# Patient Record
Sex: Female | Born: 1970 | Race: White | Hispanic: No | Marital: Single | Smoking: Current some day smoker
Health system: Southern US, Community
[De-identification: ages and names within clinical notes are randomized; demographics above are authoritative.]

## PROBLEM LIST (undated history)

## (undated) DIAGNOSIS — F419 Anxiety disorder, unspecified: Secondary | ICD-10-CM

## (undated) DIAGNOSIS — T7840XA Allergy, unspecified, initial encounter: Secondary | ICD-10-CM

## (undated) DIAGNOSIS — G43909 Migraine, unspecified, not intractable, without status migrainosus: Secondary | ICD-10-CM

## (undated) HISTORY — DX: Anxiety disorder, unspecified: F41.9

## (undated) HISTORY — DX: Migraine, unspecified, not intractable, without status migrainosus: G43.909

## (undated) HISTORY — DX: Allergy, unspecified, initial encounter: T78.40XA

## (undated) HISTORY — PX: BUNIONECTOMY: SHX129

## (undated) HISTORY — PX: HX BREAST IMPLANT: 2100001350

## (undated) HISTORY — PX: MOUTH SURGERY: SHX715

---

## 1998-05-20 HISTORY — PX: HX BUNIONECTOMY: SHX129

## 2003-05-21 HISTORY — PX: PLACEMENT OF BREAST IMPLANTS: SHX6334

## 2010-05-20 HISTORY — PX: TOOTH EXTRACTION: SUR596

## 2016-08-20 ENCOUNTER — Other Ambulatory Visit (HOSPITAL_COMMUNITY): Payer: Self-pay | Admitting: Family Medicine

## 2016-08-20 ENCOUNTER — Other Ambulatory Visit: Attending: Family Medicine

## 2016-08-20 ENCOUNTER — Other Ambulatory Visit (HOSPITAL_BASED_OUTPATIENT_CLINIC_OR_DEPARTMENT_OTHER): Payer: Self-pay | Admitting: Family Medicine

## 2016-08-20 DIAGNOSIS — Z1239 Encounter for other screening for malignant neoplasm of breast: Secondary | ICD-10-CM

## 2016-08-20 DIAGNOSIS — Z01419 Encounter for gynecological examination (general) (routine) without abnormal findings: Secondary | ICD-10-CM | POA: Insufficient documentation

## 2016-08-23 LAB — HISTORICAL CYTOPATHOLOGY-GYN (PAP AND HPV TESTS)

## 2016-10-07 ENCOUNTER — Ambulatory Visit (HOSPITAL_BASED_OUTPATIENT_CLINIC_OR_DEPARTMENT_OTHER)

## 2017-01-23 ENCOUNTER — Ambulatory Visit (HOSPITAL_BASED_OUTPATIENT_CLINIC_OR_DEPARTMENT_OTHER): Payer: Self-pay | Admitting: FAMILY MEDICINE

## 2017-02-05 ENCOUNTER — Ambulatory Visit (INDEPENDENT_AMBULATORY_CARE_PROVIDER_SITE_OTHER): Admitting: FAMILY MEDICINE

## 2017-02-05 ENCOUNTER — Encounter (HOSPITAL_BASED_OUTPATIENT_CLINIC_OR_DEPARTMENT_OTHER): Payer: Self-pay | Admitting: FAMILY MEDICINE

## 2017-02-05 ENCOUNTER — Ambulatory Visit: Attending: FAMILY MEDICINE

## 2017-02-05 VITALS — BP 102/44 | HR 66 | Ht 64.0 in | Wt 140.4 lb

## 2017-02-05 DIAGNOSIS — Z6824 Body mass index (BMI) 24.0-24.9, adult: Secondary | ICD-10-CM

## 2017-02-05 DIAGNOSIS — R5382 Chronic fatigue, unspecified: Principal | ICD-10-CM | POA: Insufficient documentation

## 2017-02-05 DIAGNOSIS — E28319 Asymptomatic premature menopause: Secondary | ICD-10-CM

## 2017-02-05 DIAGNOSIS — M419 Scoliosis, unspecified: Secondary | ICD-10-CM

## 2017-02-05 DIAGNOSIS — Z720 Tobacco use: Secondary | ICD-10-CM

## 2017-02-05 HISTORY — DX: Scoliosis, unspecified: M41.9

## 2017-02-05 HISTORY — DX: Tobacco use: Z72.0

## 2017-02-05 LAB — FSH: FSH: 4.67 m[IU]/mL

## 2017-02-05 LAB — THYROID STIMULATING HORMONE (SENSITIVE TSH): TSH: 1.11 u[IU]/mL (ref 0.465–4.680)

## 2017-02-05 LAB — THYROXINE, FREE (FREE T4): THYROXINE (T4), FREE: 1.22 ng/dL (ref 0.78–2.19)

## 2017-02-05 MED ORDER — TEMAZEPAM 30 MG CAPSULE
30.00 mg | ORAL_CAPSULE | Freq: Every evening | ORAL | 2 refills | Status: DC | PRN
Start: 2017-02-05 — End: 2017-04-18

## 2017-02-05 NOTE — Progress Notes (Signed)
[facility] Medicine  [facility], [facility]  [address]  [place] [address]      Name: Rhonda Griffin  MRN: [id number]    Patient ID: Rhonda Griffin is an 46 y.o. female.  Chief Complaint:     Chief Complaint   Patient presents with   . Establish Care   . Hormone Problem     HPI/Subjective:  HPI  Pt is a 46 yo female that presents today to establish care and inquire about hormone testing. Pt stated she was referred by a prior pt due to their findings of hormonal imbalance and life changing experience with the proper treatment. The pt reported that over the last few years she has started to feel different and isnt finding joy in her everyday life anymore due to being constantly tired during the day and wide awake at night. She reports that her menstrual cycle is still occurring regularly with is having no problems but she is starting to develop hot flashes. The pt asked questions about pre menopausal sxs and it was explained to her that some of the symptoms she is experiencing could be due to that or it could be due to other hormonal imbalances. 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 She stated that the work doesn't give her anxiety or stress but more so her everyday life. She stated that she has lost all energy to workout anymore and she finds it hard to make it through the end of each day. She described herself as a zombie during the work day. She reported that when she comes home from work and tries to sleep she cant and she will wake up throughout the night numerous times. She stated on average she wakes up 3x and sleeps 5-6hrs a night. She reported that she acknowledges the fact that she OCD but thinks there is something going on beyond that. She explained that she has anxiety about everything and anything and prefers to be alone most of the time.. She discussed her anxiety problems and explained that she was placed on  Celexa in the past and she absolutely hated it. She reported she only took it 2 days because it made her entirely too sleepy during the work day. She also reported that she is extremely distracted during the day and feels like she has ADHD but never was diagnosed with this before. The pt denies any SI/HI, manic episodes, not sleeping for days and intent to hurt others.     Patient Active Problem List    Diagnosis   . Tobacco user   . Scoliosis     Current Outpatient Prescriptions   Medication Sig   . Ascorbic Acid (VITAMIN C) 1,000 mg Oral Tablet Take 1,000 mg by mouth Once a day   . BIOTIN ORAL Take by mouth   . Cholecalciferol, Vitamin D3, (VITAMIN D-3) 5,000 unit Oral Tablet Take by mouth   . CRANBERRY ORAL Take by mouth   . multivitamin Oral Tablet Take 1 Tab by mouth Once a day   . temazepam (RESTORIL) 30 mg Oral Capsule Take 1 Cap (30 mg total) by mouth Every night as needed for Insomnia Take 8 hours prior to awake time   . turmeric (CURCUMIN N/A) by Does not apply route     Past Medical History:   Diagnosis Date   . Scoliosis 02/05/2017   . Tobacco user 02/05/2017  Past Surgical History:   Procedure Laterality Date   . HX BREAST IMPLANT  1999 and 2005   . HX BUNIONECTOMY  2000   . MOUTH SURGERY       Family Medical History     Problem Relation (Age of Onset)    Cancer Maternal Grandmother    Coronary Artery Disease Maternal Grandfather    No Known Problems Mother, Father        Social History     Social History   . Marital status: Single     Spouse name: N/A   . Number of children: 0   . Years of education: college     Occupational History   . CPA      Social History Main Topics   . Smoking status: Current Every Day Smoker     Packs/day: 0.50   . Smokeless tobacco: Never Used   . Alcohol use No   . Drug use: Not on file   . Sexual activity: Not on file     Other Topics Concern   . Not on file     Social History Narrative    Wears seatbelts        Review of Systems   Neurological: Positive for headaches.      Psychiatric/Behavioral: Positive for sleep disturbance. The patient is nervous/anxious.    All other systems reviewed and are negative.    BP (!) 102/44  Pulse 66  Ht 1.626 m ( )  Wt 63.7 kg (140 lb 6.4 oz)  SpO2 95%  BMI 24.1 kg/m2    Examination:  Physical Exam   Constitutional: Vital signs are normal. She appears well-developed and well-nourished. She does not have a sickly appearance. No distress.   HENT:   Head: Normocephalic and atraumatic.   Mouth/Throat: Oropharynx is clear and moist.   Eyes: Pupils are equal, round, and reactive to light. Conjunctivae and EOM are normal.   Neck: Neck supple. No JVD present. Carotid bruit is not present. No thyromegaly present.   Cardiovascular: Normal rate, regular rhythm, normal heart sounds and intact distal pulses.  Exam reveals no gallop and no friction rub.    No murmur heard.  Pulmonary/Chest: Effort normal and breath sounds normal. No respiratory distress. She has no wheezes. She has no rales.   Abdominal: Soft. Bowel sounds are normal. She exhibits no distension, no abdominal bruit and no mass. There is no hepatosplenomegaly. There is no tenderness. There is no rebound and no guarding.   Musculoskeletal: Normal range of motion. She exhibits no edema or tenderness.   Skin: Skin is warm and dry. No bruising, no petechiae and no rash noted. No cyanosis. No pallor. Nails show no clubbing.   Psychiatric: Her speech is normal and behavior is normal. Judgment and thought content normal. Her mood appears anxious. Cognition and memory are normal.   Nursing note and vitals reviewed.    Ortho Exam      Assessment:  1. Chronic fatigue    2. Tobacco user    3. Scoliosis, unspecified scoliosis type, unspecified spinal region    4. Early menopause      We will check basic screening labs and add labs to rule out possible cause of anxiety, fatigue, and early menopause.  The workup was discussed with the pt and each step was explained so she would understand why. We  recommended her to read a book titled Brain Lock to help her see into her OCD. We informed her on  the labs we were ordering and told her we will be able to check her pituitary and adrenal function to determine if those hormones are causing her symptoms as well. In regards to her recent insomnia we discussed that fixing the hormonal imbalance if present could resolve this but in the mean we discussed placing the pt on Restoril with risks vs benefits, MOA, and possible SE discussed. The pt expressed how she is a firm believer of vitamins and explained the ones she was already taking through [facility] and we suggested 2 other vitamins to her, 5HTP and SAM-E to take to possibly increase her serotonin and dopamine levels instead of taking prescription medication. Based off of the lab results and findings will guide out treatment plan.       Plan:  Orders Placed This Encounter   . THYROID STIMULATING HORMONE (SENSITIVE TSH)   . THYROXINE, TOTAL T4   . THIIODOTHYRONINE, TOTAL (TOTAL T3)   . THYROXINE, FREE (FREE T4)   . T3 (TRIIODOTHYRONINE), FREE, SERUM   . ESTRADIOL   . PROGESTERONE   . FSH   . LH   . CORTISOL, AM   . T3 (TRIIODOTHYRONINE), REVERSE, SERUM   . TESTOSTERONE, TOTAL, LCMS, SERUM   . temazepam (RESTORIL) 30 mg Oral Capsule       Thamas Jaegers, STUDENT PHYSICIAN ASSISTANT    Patient was formally presented to me by my student and then personally examined by me.  I agree with the H&P and A&P above.  Darla Lesches, M.D.

## 2017-02-10 LAB — T3 (TRIIODOTHYRONINE), REVERSE, SERUM: T3, TRIIODOTHYRONINE, REVERSE: 21 ng/dL (ref 10–24)

## 2017-02-12 ENCOUNTER — Ambulatory Visit (INDEPENDENT_AMBULATORY_CARE_PROVIDER_SITE_OTHER): Admitting: Physician Assistant

## 2017-02-12 ENCOUNTER — Encounter (HOSPITAL_BASED_OUTPATIENT_CLINIC_OR_DEPARTMENT_OTHER): Payer: Self-pay | Admitting: Physician Assistant

## 2017-02-12 VITALS — BP 98/56 | HR 61 | Temp 98.0°F | Ht 64.0 in | Wt 140.8 lb

## 2017-02-12 DIAGNOSIS — J3089 Other allergic rhinitis: Secondary | ICD-10-CM

## 2017-02-12 DIAGNOSIS — Z6824 Body mass index (BMI) 24.0-24.9, adult: Secondary | ICD-10-CM

## 2017-02-12 DIAGNOSIS — J309 Allergic rhinitis, unspecified: Secondary | ICD-10-CM

## 2017-02-12 MED ORDER — MONTELUKAST 10 MG TABLET
10.0000 mg | ORAL_TABLET | Freq: Every evening | ORAL | 3 refills | Status: DC
Start: 2017-02-12 — End: 2017-08-12

## 2017-02-12 MED ORDER — MONTELUKAST 10 MG TABLET
10.00 mg | ORAL_TABLET | Freq: Every evening | ORAL | 3 refills | Status: DC
Start: 2017-02-12 — End: 2017-02-12

## 2017-02-12 NOTE — Patient Instructions (Signed)
Sinus Regimen    Days 1-3: in the morning use afrin as directed on the box in each nostril, followed by neil med saline sinus rinse on both sides, followed by nasacort 2 sprays in each nostril.   Repeat this regimen at night    Day 4+: stop using the afrin and continue using the sinus rinse, followed by nasacort 2 sprays in each nostril in morning and evening.

## 2017-02-12 NOTE — Progress Notes (Signed)
Patient Name: Rhonda Griffin  Date: 02/12/2017  [facility], [facility]  [address]  [place] [address]  [phone]  MRN: [id number]  DOB: 1970-11-26    Chief complaint:   Sinus Problem.    HPI:  The patient is a 46 y.o. old female who came in today for evaluation of nasal congestion, runny nose, sinus drainage, and productive cough x1 month. She was initially seen at [facility] ED earlier in September for these sx, had negative w/u including CXR, rx zpack. She began feeling better and had only mild sx for a couple of weeks. Several days ago her sx began to worsen and currently she c/o the above sx. She denies any fever/chills, body aches, malaise, CP, SOB, abdominal pain, n/v/d. She takes claritin every day. She also uses nasacort PRN. She states that she has allergy sx around this time every year and has been allergy tested in the past and was positive for ragweed allergy. (S)he has no other complaints or concerns today.      ROS:  Negative except for those mentioned in HPI       Past medical history:  Past Medical History:   Diagnosis Date   . Scoliosis 02/05/2017   . Tobacco user 02/05/2017         Past surgical history:  Past Surgical History:   Procedure Laterality Date   . HX BREAST IMPLANT  1999 and 2005   . HX BUNIONECTOMY  2000   . MOUTH SURGERY           Medications:  Current Outpatient Prescriptions   Medication Sig   . Ascorbic Acid (VITAMIN C) 1,000 mg Oral Tablet Take 1,000 mg by mouth Once a day   . BIOTIN ORAL Take by mouth   . Cholecalciferol, Vitamin D3, (VITAMIN D-3) 5,000 unit Oral Tablet Take by mouth   . CRANBERRY ORAL Take by mouth   . diphenhydramine HCl (BENADRYL ALLERGY ORAL) Take by mouth   . LORATADINE ORAL Take by mouth   . montelukast (SINGULAIR) 10 mg Oral Tablet Take 1 Tab (10 mg total) by mouth Every evening   . multivitamin Oral Tablet Take 1 Tab by mouth Once a day   . temazepam (RESTORIL) 30 mg Oral Capsule Take 1 Cap (30 mg  total) by mouth Every night as needed for Insomnia Take 8 hours prior to awake time   . turmeric (CURCUMIN N/A) by Does not apply route   . [DISCONTINUED] montelukast (SINGULAIR) 10 mg Oral Tablet Take 1 Tab (10 mg total) by mouth Every evening     Allergies:  Allergies   Allergen Reactions   . Codeine Nausea/ Vomiting     Family history:  Family Medical History     Problem Relation (Age of Onset)    Cancer Maternal Grandmother    Coronary Artery Disease Maternal Grandfather    No Known Problems Mother, Father            Social history:  Social History     Social History   . Marital status: Single     Spouse name: N/A   . Number of children: 0   . Years of education: college     Occupational History   . CPA      Social History Main Topics   . Smoking status: Current Every Day Smoker     Packs/day: 0.50   . Smokeless tobacco: Never Used   . Alcohol  use No   . Drug use: Not on file   . Sexual activity: Not on file     Other Topics Concern   . Not on file     Social History Narrative    Wears seatbelts       Vitals:    02/12/17 1341   BP: (!) 98/56   Pulse: 61   Temp: 36.7 C (98 F)   TempSrc: Oral   SpO2: 98%   Weight: 63.9 kg (140 lb 12.8 oz)   Height: 1.626 m ( )   BMI: 24.22     Body mass index is 24.17 kg/(m^2).    Physical Examination:  General: acutely ill, appears stated age, no distress and vital signs reviewed  HENT:Left TM and canal normal. , Tympanic Membrane - Right: erythematous, External Auditory Canal - Right:  Inflamed., Nasopharynx: purulent post nasal drainage noted., Mouth mucous membranes moist. , Pharynx without injection or exudate.   Lungs: Clear to auscultation bilaterally.   Cardiovascular: regular rate and rhythm, S1, S2 normal, no murmur, click, rub or gallop  Psychiatric: Normal affect, behavior, memory, thought content, judgement, and speech.        Visit Diagnosis    ICD-10-CM    1. Allergic rhinitis J30.9    2. Environmental and seasonal allergies J30.89      Orders Placed This  Encounter   . montelukast (SINGULAIR) 10 mg Oral Tablet     Sx most likely due to allergies given the timing and the lack of fever/chills or other sx indicating bacterial infection. Rx for singulair as above, MOA and side effects discussed and pt verbalized understanding.  Advised to take claritin daily, may take another dose at night if needed  Gave pt sinus regimen as below:   Days 1-3: in the morning use afrin as directed on the box in each nostril, followed by neil med saline sinus rinse on both sides, followed by nasacort 2 sprays in each nostril.   Repeat this regimen at night    Day 4+: stop using the afrin and continue using the sinus rinse, followed by nasacort 2 sprays in each nostril in morning and evening.     Advised not to use afrin more than 3 days at a time. Advised pt may start this regimen at the first sign of feeling congested or allergy flare up  Keep regular f/u with Dr. Harl Favor, PA-C  02/12/2017, 14:50  Electronically signed by Gladstone Lighter, PA-C

## 2017-02-13 ENCOUNTER — Encounter (HOSPITAL_BASED_OUTPATIENT_CLINIC_OR_DEPARTMENT_OTHER): Payer: Self-pay | Admitting: FAMILY MEDICINE

## 2017-02-13 ENCOUNTER — Ambulatory Visit (INDEPENDENT_AMBULATORY_CARE_PROVIDER_SITE_OTHER): Admitting: FAMILY MEDICINE

## 2017-02-13 DIAGNOSIS — E279 Disorder of adrenal gland, unspecified: Secondary | ICD-10-CM

## 2017-02-13 DIAGNOSIS — E559 Vitamin D deficiency, unspecified: Secondary | ICD-10-CM | POA: Insufficient documentation

## 2017-02-13 DIAGNOSIS — F419 Anxiety disorder, unspecified: Secondary | ICD-10-CM

## 2017-02-13 DIAGNOSIS — G4700 Insomnia, unspecified: Secondary | ICD-10-CM

## 2017-02-13 DIAGNOSIS — E2839 Other primary ovarian failure: Secondary | ICD-10-CM

## 2017-02-13 DIAGNOSIS — Z6824 Body mass index (BMI) 24.0-24.9, adult: Secondary | ICD-10-CM

## 2017-02-13 NOTE — Progress Notes (Signed)
[facility] Medicine  [facility]  [address]  [place] [address]      Name: Rhonda Griffin  MRN: [id number]    Patient ID: Rhonda Griffin is an 46 y.o. female.  Chief Complaint:     Chief Complaint   Patient presents with   . Lab Results     HPI/Subjective:  HPI Patient presents to discuss lab results.  Her testosterone was low.  Her RT3 was up showing thyroid inefficiency even in light of normal thyroid studies suggesting adrenal fatigue.  Everything else looked good and will be listed under lab section for review.  Restoril has been effective on most nights.  She plans to use only sparingly once feeling better.  She purchased SAM-E as well as Vitamin D, B12, Mg, fish oils, and probiotics from [facility].  She ordered the book Brain Lock.     Review of Systems per initial intake form    BP (!) 98/56  Pulse 61  Ht 1.626 m ( )  Wt 63.5 kg (140 lb)  SpO2 98%  BMI 24.03 kg/m2      Examination:  Physical Exam deferred to initial comprehensive physical exam  Ortho Exam     Results for orders placed or performed in visit on 02/05/17 (from the past 1008 hour(s))   THYROID STIMULATING HORMONE (SENSITIVE TSH)   Result Value Ref Range    TSH 1.110 0.465 - 4.680 uIU/mL   THYROXINE, TOTAL T4   Result Value Ref Range    T4 TOTAL 9.0 5.5 - 11.0 ug/dL   THIIODOTHYRONINE, TOTAL (TOTAL T3)   Result Value Ref Range    T3 TOTAL 128 97 - 169 ng/dL   THYROXINE, FREE (FREE T4)   Result Value Ref Range    THYROXINE (T4), FREE 1.22 0.78 - 2.19 ng/dL   T3 (TRIIODOTHYRONINE), FREE, SERUM   Result Value Ref Range    T3 FREE 3.4 2.8 - 5.2 pg/mL   ESTRADIOL   Result Value Ref Range    ESTRADIOL 149 pg/mL   PROGESTERONE   Result Value Ref Range    PROGESTERONE 0.5 ng/mL   [facility]   Result Value Ref Range    FSH 4.67 mIU/mL    LH   Result Value Ref Range    LH 2.63 mIU/mL    CORTISOL, AM   Result Value Ref Range    CORTISOL, AM 9.12 4.46 - 22.70 ug/dL   T3 (TRIIODOTHYRONINE), REVERSE, SERUM   Result  Value Ref Range    T3, TRIIODOTHYRONINE, REVERSE 21 10 - 24 ng/dL   TESTOSTERONE, TOTAL, LCMS, SERUM   Result Value Ref Range    TESTOSTERONE, TOTAL, SERUM 13 8 - 60 ng/dL   ]    Assessment:  1. Female hypogonadism    2. Adrenal disorder (CMS HCC)    3. Anxiety    4. Insomnia, unspecified type    5. Vitamin D deficiency      We had a great discussion about her health.  We are going to start testosterone .5% cream from [facility] and Adrenal vitamins from [facility] as well as continuing her other supplements and SAM-E.  She will try to use Restoril only as needed once she starts feeling better.  F/U 3 months.    Katy Apo, MD

## 2017-03-11 ENCOUNTER — Ambulatory Visit (HOSPITAL_BASED_OUTPATIENT_CLINIC_OR_DEPARTMENT_OTHER): Payer: Self-pay | Admitting: FAMILY MEDICINE

## 2017-03-11 NOTE — Telephone Encounter (Signed)
DB please see below. Thanks

## 2017-03-11 NOTE — Telephone Encounter (Signed)
Regarding: Dr. Shon BatonBrooks  ----- Message from Verdie DrownKara D Reamer sent at 03/11/2017  9:30 AM EDT -----  Pt called in stating she started having bad side effects with the testosterone cream.  She was on this for about 3 weeks and the effects kept getting worse, such as headache, metal taste in mouth, nausea/vomiting, lip tingling.  She stopped the cream two days ago and is feeling better.  Pt also states the supplements she started on have helped tremendously and she feels she is ok to continue them only for now and revisit at appt in January.  Call back number is [phone].

## 2017-03-11 NOTE — Telephone Encounter (Signed)
OK.  She can either continue to hold or if she wants to try one more time she can.  If it happens again, we know it's the culprit.

## 2017-03-14 NOTE — Telephone Encounter (Signed)
I left a detailed vm informing pt of below.

## 2017-04-01 ENCOUNTER — Encounter (HOSPITAL_BASED_OUTPATIENT_CLINIC_OR_DEPARTMENT_OTHER): Payer: Self-pay | Admitting: Physician Assistant

## 2017-04-01 ENCOUNTER — Ambulatory Visit (INDEPENDENT_AMBULATORY_CARE_PROVIDER_SITE_OTHER): Admitting: Physician Assistant

## 2017-04-01 VITALS — BP 111/72 | HR 65 | Ht 64.0 in | Wt 138.2 lb

## 2017-04-01 DIAGNOSIS — F410 Panic disorder [episodic paroxysmal anxiety] without agoraphobia: Secondary | ICD-10-CM

## 2017-04-01 DIAGNOSIS — Z6823 Body mass index (BMI) 23.0-23.9, adult: Secondary | ICD-10-CM

## 2017-04-01 DIAGNOSIS — F419 Anxiety disorder, unspecified: Principal | ICD-10-CM

## 2017-04-01 MED ORDER — BUSPIRONE 10 MG TABLET
ORAL_TABLET | ORAL | 3 refills | Status: DC
Start: 2017-04-01 — End: 2017-04-18

## 2017-04-01 NOTE — Progress Notes (Signed)
Patient Name: Rhonda Griffin  Date: 04/01/2017  [facility]  [facility]  [address]  [place] [id number]  [phone]  MRN: [id number]  DOB: 08/05/1970    Chief complaint:   Anxiety.    HPI:  The patient is a 46 y.o. old female who came in today for anxiety attacks x6 days. She recently took a new job and has been finding a lot of mistakes that her predecessor made and she explains that now she is getting a lot of backlash from that and is also expected to fix those mistakes. This has made her very anxious and unable to perform as well at work. She has been having anxiety attacks and states that she worked from home today due to feeling like she was unable to be around people. She does not typically like to take medications and currently does not take anything for anxiety. She does take restoril for sleep which helps a lot and she wakes up feeling refreshed. She does not have any other complaints or concerns today.     ROS:  Negative except for those mentioned in HPI       Past medical history:  Past Medical History:   Diagnosis Date   . Scoliosis 02/05/2017   . Tobacco user 02/05/2017         Past surgical history:  Past Surgical History:   Procedure Laterality Date   . HX BREAST IMPLANT  1999 and 2005   . HX BUNIONECTOMY  2000   . MOUTH SURGERY           Medications:  Current Outpatient Prescriptions   Medication Sig   . Ascorbic Acid (VITAMIN C) 1,000 mg Oral Tablet Take 1,000 mg by mouth Once a day   . BIOTIN ORAL Take by mouth   . busPIRone (BUSPAR) 10 mg Oral Tablet Take 1-1.5 tabs TID PRN   . Cholecalciferol, Vitamin D3, (VITAMIN D-3) 5,000 unit Oral Tablet Take by mouth   . CRANBERRY ORAL Take by mouth   . diphenhydramine HCl (BENADRYL ALLERGY ORAL) Take by mouth   . LORATADINE ORAL Take by mouth   . montelukast (SINGULAIR) 10 mg Oral Tablet Take 1 Tab (10 mg total) by mouth Every evening   . multivitamin Oral Tablet Take 1 Tab by mouth Once a day    . temazepam (RESTORIL) 30 mg Oral Capsule Take 1 Cap (30 mg total) by mouth Every night as needed for Insomnia Take 8 hours prior to awake time   . turmeric (CURCUMIN N/A) by Does not apply route     Allergies:  Allergies   Allergen Reactions   . Codeine Nausea/ Vomiting     Family history:  Family Medical History:     Problem Relation (Age of Onset)    Cancer Maternal Grandmother    Coronary Artery Disease Maternal Grandfather    No Known Problems Mother, Father            Social history:  Social History     Social History   . Marital status: Single     Spouse name: N/A   . Number of children: 0   . Years of education: college     Occupational History   . 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 Smoking status: Current Every Day Smoker     Packs/day: 0.50   . Smokeless tobacco: Never Used   .  Alcohol use No   . Drug use: Not on file   . Sexual activity: Not on file     Other Topics Concern   . Not on file     Social History Narrative    No exercise regimen. Eats mostly home-prepared meals, dines out occasionally. Drinks mostly water. Wears seatbelts.        Vitals:    04/01/17 1431   BP: 111/72   Pulse: 65   SpO2: 100%   Weight: 62.7 kg (138 lb 3.2 oz)   Height: 1.626 m (5\' 4" )   BMI: 23.77         Body mass index is 23.72 kg/(m^2).    Physical Examination:  General: appears in good health, appears stated age, no distress and vital signs reviewed  Lungs: Clear to auscultation bilaterally.   Cardiovascular: regular rate and rhythm, S1, S2 normal, no murmur, click, rub or gallop  Psychiatric: Anxious, No suicidal or homicidal ideations, Behavior normal, Thought content normal, Judgement normal, Speech normal        Visit Diagnosis    ICD-10-CM    1. Anxiety F41.9    2. Panic attack F41.0      Orders Placed This Encounter   . busPIRone (BUSPAR) 10 mg Oral Tablet       will try buspar as rx above. Continue restoril QHS. Pt will call if problems with the buspar. Advised her she can take half  tablet if 10mg  is too much or 1.5 tabs if not enough.        Gladstone Lighteriffany Saddler, PA-C  04/01/2017, 14:42  Electronically signed by Gladstone Lighteriffany Saddler, PA-C

## 2017-04-18 ENCOUNTER — Ambulatory Visit (INDEPENDENT_AMBULATORY_CARE_PROVIDER_SITE_OTHER): Admitting: Physician Assistant

## 2017-04-18 DIAGNOSIS — Z6823 Body mass index (BMI) 23.0-23.9, adult: Secondary | ICD-10-CM

## 2017-04-18 DIAGNOSIS — F419 Anxiety disorder, unspecified: Secondary | ICD-10-CM

## 2017-04-18 DIAGNOSIS — F410 Panic disorder [episodic paroxysmal anxiety] without agoraphobia: Secondary | ICD-10-CM

## 2017-04-18 MED ORDER — TEMAZEPAM 30 MG CAPSULE
30.0000 mg | ORAL_CAPSULE | Freq: Every evening | ORAL | 2 refills | Status: AC | PRN
Start: 2017-04-18 — End: ?

## 2017-04-18 MED ORDER — SERTRALINE 50 MG TABLET
50.00 mg | ORAL_TABLET | Freq: Every evening | ORAL | 2 refills | Status: DC
Start: 2017-04-18 — End: 2017-05-21

## 2017-04-18 MED ORDER — ALPRAZOLAM 0.5 MG TABLET
0.50 mg | ORAL_TABLET | Freq: Three times a day (TID) | ORAL | 0 refills | Status: DC | PRN
Start: 2017-04-18 — End: 2017-05-21

## 2017-04-18 NOTE — Progress Notes (Signed)
Patient Name: Rhonda Griffin  Date: 04/18/2017  [facility]  [facility], [facility]  [address]  [place] [id number]  [phone]  MRN: [id number]  DOB: 03/13/1971    Chief complaint:   Anxiety.    HPI:  The patient is a 46 y.o. old female who came in today for anxiety. She was also seen recently and started on Buspar but states that it gives her severe headaches each time she takes it. She is very anxious during this visit, becoming tearful several times discussing her current situation at her job. She feels like she is being targeted and harrassed at work and has filed a harassment complaint as well as filed a complaint with EEO. She states that she had a couple of very distressing situations this week and now she is having more frequent panic attacks and the severity of her anxiety has escalated greatly. She states that she feels "terrified" to go to work now because she is unsure what the situation will be. She states that she is open to trying anything to feel better at this point. No other complaints or concerns today.     ROS:  Negative except for those mentioned in HPI       Past medical history:  Past Medical History:   Diagnosis Date   . Scoliosis 02/05/2017   . Tobacco user 02/05/2017         Past surgical history:  Past Surgical History:   Procedure Laterality Date   . HX BREAST IMPLANT  1999 and 2005   . HX BUNIONECTOMY  2000   . MOUTH SURGERY           Medications:  Current Outpatient Prescriptions   Medication Sig   . ALPRAZolam (XANAX) 0.5 mg Oral Tablet Take 1 Tab (0.5 mg total) by mouth Three times a day as needed for Insomnia   . Ascorbic Acid (VITAMIN C) 1,000 mg Oral Tablet Take 1,000 mg by mouth Once a day   . BIOTIN ORAL Take by mouth   . Cholecalciferol, Vitamin D3, (VITAMIN D-3) 5,000 unit Oral Tablet Take by mouth   . CRANBERRY ORAL Take by mouth   . diphenhydramine HCl (BENADRYL ALLERGY ORAL) Take by mouth   . LORATADINE ORAL Take by mouth   .  montelukast (SINGULAIR) 10 mg Oral Tablet Take 1 Tab (10 mg total) by mouth Every evening   . multivitamin Oral Tablet Take 1 Tab by mouth Once a day   . sertraline (ZOLOFT) 50 mg Oral Tablet Take 1 Tab (50 mg total) by mouth Every night   . temazepam (RESTORIL) 30 mg Oral Capsule Take 1 Cap (30 mg total) by mouth Every night as needed for Insomnia Take 8 hours prior to awake time   . turmeric (CURCUMIN N/A) by Does not apply route     Allergies:  Allergies   Allergen Reactions   . Codeine Nausea/ Vomiting     Family history:  Family Medical History:     Problem Relation (Age of Onset)    Cancer Maternal Grandmother    Coronary Artery Disease Maternal Grandfather    No Known Problems Mother, Father            Social history:  Social History     Social History   . Marital status: Single     Spouse name: N/A   . Number of children: 0   . Years of education: college  Occupational History   . 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 Smoking status: Current Every Day Smoker     Packs/day: 0.50   . Smokeless tobacco: Never Used   . Alcohol use No   . Drug use: Not on file   . Sexual activity: Not on file     Other Topics Concern   . Not on file     Social History Narrative    No exercise regimen. Eats mostly home-prepared meals, dines out occasionally. Drinks mostly water. Wears seatbelts.        Vitals:    04/18/17 0907   BP: 110/73   Pulse: 86   SpO2: 97%   Weight: 61.8 kg (136 lb 3.2 oz)   Height: 1.626 m (5\' 4" )   BMI: 23.43         Body mass index is 23.38 kg/(m^2).    Physical Examination:  General: appears in good health, appears stated age, mild distress and vital signs reviewed  Lungs: Clear to auscultation bilaterally.   Cardiovascular: regular rate and rhythm, S1, S2 normal, no murmur, click, rub or gallop  Psychiatric: Anxious, No suicidal or homicidal ideations, Behavior normal, Memory normal, Thought content normal, Judgement normal, Speech normal        Visit Diagnosis     ICD-10-CM    1. Anxiety F41.9    2. Panic disorder F41.0      Orders Placed This Encounter   . ALPRAZolam (XANAX) 0.5 mg Oral Tablet   . temazepam (RESTORIL) 30 mg Oral Capsule   . sertraline (ZOLOFT) 50 mg Oral Tablet     D/c buspar  Start xanax as rx above. Advised for only PRN use. MOA and side effects discussed and pt verbalized understanding.  Start zoloft as rx above MOA and side effects discussed and pt verbalized understanding.  Call if problems or concerns         Gladstone Lighteriffany Saddler, PA-C  04/18/2017, 09:53  Electronically signed by Gladstone Lighteriffany Saddler, PA-C

## 2017-05-07 ENCOUNTER — Ambulatory Visit (HOSPITAL_BASED_OUTPATIENT_CLINIC_OR_DEPARTMENT_OTHER): Payer: Self-pay | Admitting: FAMILY MEDICINE

## 2017-05-07 NOTE — Telephone Encounter (Signed)
Can you just schedule her with Tiffany and [facility]

## 2017-05-07 NOTE — Telephone Encounter (Signed)
Please see below and advise.

## 2017-05-07 NOTE — Telephone Encounter (Signed)
Regarding: Dr. Shon BatonBrooks  ----- Message from Odette FractionKimberly M Barclay sent at 05/07/2017  3:02 PM EST -----  Pt. States that she has had heightened anxiety lately due to work issues. When she is getting ready to have a panic attack the meds usually help fine, but nothing is getting better.  Tiffany had written her a letter that in an acute condition that it is work related. She has an added stress on her at the present moment due to a special situation at work. In the meantime, she is going through EEO for 100% tele-work. When working from home she is fine. She does not have to take even one anxiety pill.  She is needing an appt with Tiffany to make sure that she is on what she thinks she needs to be on and then fill out a form with notes for her. She wants to talk with a nurse to see if there is something else that needs to be done before this step.  Pt. Can be reached at [phone]

## 2017-05-08 NOTE — Telephone Encounter (Signed)
Appt sched 05/21/17 @ 8:00 with TS

## 2017-05-21 ENCOUNTER — Ambulatory Visit (INDEPENDENT_AMBULATORY_CARE_PROVIDER_SITE_OTHER): Admitting: Physician Assistant

## 2017-05-21 ENCOUNTER — Encounter (HOSPITAL_BASED_OUTPATIENT_CLINIC_OR_DEPARTMENT_OTHER): Payer: Self-pay | Admitting: Physician Assistant

## 2017-05-21 VITALS — BP 108/69 | HR 78 | Ht 64.0 in | Wt 135.2 lb

## 2017-05-21 DIAGNOSIS — F419 Anxiety disorder, unspecified: Secondary | ICD-10-CM

## 2017-05-21 DIAGNOSIS — Z6823 Body mass index (BMI) 23.0-23.9, adult: Secondary | ICD-10-CM

## 2017-05-21 MED ORDER — SERTRALINE 100 MG TABLET
100.0000 mg | ORAL_TABLET | Freq: Every day | ORAL | 3 refills | Status: DC
Start: 2017-05-21 — End: 2017-09-29

## 2017-05-21 MED ORDER — ALPRAZOLAM 0.5 MG TABLET
0.50 mg | ORAL_TABLET | Freq: Two times a day (BID) | ORAL | 1 refills | Status: DC | PRN
Start: 2017-05-21 — End: 2017-08-26

## 2017-05-21 NOTE — Progress Notes (Signed)
Patient Name: Rhonda Griffin  Date: 05/21/2017  [facility]  [facility], [facility]  [address]  [place] [id number]  [phone]  MRN: [id number]  DOB: 02/16/1971    Chief complaint:   Anxiety and Medication follow up.    HPI:  The patient is a 47 y.o. old female who came in today for f/u for anxiety. She is still having a lot of anxiety over a bad work situation and has put in applications to other places. She is trying very hard to get out of the situation. She does not feel like the zoloft is helping and has been taking the xanax BID in order to get through the workday. She does not typically have to take it whenever she is not working or when she works from home. She would like to try to increase the zoloft so that she might be able to taper off the xanax. (S)he has no other complaints or concerns today.      ROS:  Negative except for those mentioned in HPI       Past medical history:  Past Medical History:   Diagnosis Date   . Scoliosis 02/05/2017   . Tobacco user 02/05/2017         Past surgical history:  Past Surgical History:   Procedure Laterality Date   . HX BREAST IMPLANT  1999 and 2005   . HX BUNIONECTOMY  2000   . MOUTH SURGERY           Medications:  Current Outpatient Medications   Medication Sig   . ALPRAZolam (XANAX) 0.5 mg Oral Tablet Take 1 Tab (0.5 mg total) by mouth Twice per day as needed for Insomnia   . Ascorbic Acid (VITAMIN C) 1,000 mg Oral Tablet Take 1,000 mg by mouth Once a day   . BIOTIN ORAL Take by mouth   . Cholecalciferol, Vitamin D3, (VITAMIN D-3) 5,000 unit Oral Tablet Take by mouth   . CRANBERRY ORAL Take by mouth   . diphenhydramine HCl (BENADRYL ALLERGY ORAL) Take by mouth   . LORATADINE ORAL Take by mouth   . montelukast (SINGULAIR) 10 mg Oral Tablet Take 1 Tab (10 mg total) by mouth Every evening   . multivitamin Oral Tablet Take 1 Tab by mouth Once a day   . sertraline (ZOLOFT) 100 mg Oral Tablet Take 1 Tab (100 mg total) by  mouth Once a day   . temazepam (RESTORIL) 30 mg Oral Capsule Take 1 Cap (30 mg total) by mouth Every night as needed for Insomnia Take 8 hours prior to awake time   . turmeric (CURCUMIN N/A) by Does not apply route     Allergies:  Allergies   Allergen Reactions   . Codeine Nausea/ Vomiting     Family history:  Family Medical History:     Problem Relation (Age of Onset)    Cancer Maternal Grandmother    Coronary Artery Disease Maternal Grandfather    No Known Problems Mother, Father            Social history:  Social History     Socioeconomic History   . Marital status: Single     Spouse name: Not on file   . Number of children: 0   . Years of education: college   . Highest education level: Not on file   Social Needs   . Financial resource strain: Not on file   . Food insecurity - worry:  Not on file   . Food insecurity - inability: Not on file   . Transportation needs - medical: Not on file   . Transportation needs - non-medical: Not on file   Occupational History   . Occupation: Microbiologist: [employer]   Tobacco Use   . Smoking status: Current Every Day Smoker     Packs/day: 0.50   . Smokeless tobacco: Never Used   Substance and Sexual Activity   . Alcohol use: No   . Drug use: Not on file   . Sexual activity: Not on file   Other Topics Concern   . Not on file   Social History Narrative    No exercise regimen. Eats mostly home-prepared meals, dines out occasionally. Drinks mostly water. Wears seatbelts.        Vitals:    05/21/17 0806   BP: 108/69   Pulse: 78   SpO2: 97%   Weight: 61.3 kg (135 lb 3.2 oz)   Height: 1.626 m (5\' 4" )   BMI: 23.26         Body mass index is 23.21 kg/m.    Physical Examination:  General: appears in good health, appears stated age, no distress and vital signs reviewed  Lungs: Clear to auscultation bilaterally.   Cardiovascular: regular rate and rhythm, S1, S2 normal, no murmur, click, rub or gallop  Psychiatric: Anxious, Behavior normal, Memory normal, Thought content  normal, Judgement normal, Speech normal        Visit Diagnosis    ICD-10-CM    1. Anxiety F41.9      Orders Placed This Encounter   . ALPRAZolam (XANAX) 0.5 mg Oral Tablet   . sertraline (ZOLOFT) 100 mg Oral Tablet     Increase zoloft to 100mg  daily. Refill xanax for BID PRN.   F/u in 3-4 weeks or PRN  No follow-ups on file.       Gladstone Lighter, PA-C  05/21/2017, 09:45  Electronically signed by Gladstone Lighter, PA-C

## 2017-05-23 ENCOUNTER — Encounter (HOSPITAL_BASED_OUTPATIENT_CLINIC_OR_DEPARTMENT_OTHER): Payer: Self-pay | Admitting: FAMILY MEDICINE

## 2017-07-08 ENCOUNTER — Ambulatory Visit (INDEPENDENT_AMBULATORY_CARE_PROVIDER_SITE_OTHER): Admitting: Family Medicine

## 2017-07-08 ENCOUNTER — Ambulatory Visit (HOSPITAL_BASED_OUTPATIENT_CLINIC_OR_DEPARTMENT_OTHER): Payer: Self-pay | Admitting: Physician Assistant

## 2017-07-08 ENCOUNTER — Encounter (HOSPITAL_BASED_OUTPATIENT_CLINIC_OR_DEPARTMENT_OTHER): Payer: Self-pay | Admitting: Family Medicine

## 2017-07-08 VITALS — BP 108/78 | HR 70 | Temp 98.5°F | Ht 64.0 in | Wt 134.0 lb

## 2017-07-08 DIAGNOSIS — H6690 Otitis media, unspecified, unspecified ear: Secondary | ICD-10-CM

## 2017-07-08 MED ORDER — CEFDINIR 300 MG CAPSULE
600.0000 mg | ORAL_CAPSULE | Freq: Every day | ORAL | 0 refills | Status: AC
Start: 2017-07-08 — End: 2017-07-18

## 2017-07-08 NOTE — Progress Notes (Signed)
[facility]  Patient Name: Rhonda Griffin  Date: 07/08/2017  [facility]  [address]  [place] [address]  [phone]  MRN: [id number]  DOB: 1970-08-20    Chief complaint: Ear Ache (Bilateral); Nasal Congestion (yellow productive); and Productive Cough (yellow productive).    HPI:  The patient is a 47 y.o. old female with onset today of bilateral earache, left greater than right side, also with nasal stuffiness and runny nose yellow drainage.  Also some productive cough yellow sputum.  T-max 99.9.  Above symptoms, she was having some ear pressure feelings over the last several days.  She states she has a history of tinnitus.  She does not report any dizziness or vertigo, no other symptoms reported.    ROS:    Constitutional: No unintentional weight gain/loss, fever, chills, fatigue, tiredness  Skin: No skin problems or rashes, itching  HEENT:  See HPI.  Cardiovascular: No heart valve problems/murmurs, irregular heartbeat, angina/chest pain, swelling in feet, swelling in hands, shortness of breath lying flat, short of breath climbing stairs, palpitations  Respiratory: No shortness of breath, cough, hemoptysis, wheezing    Past medical history:  Past Medical History:   Diagnosis Date   . Scoliosis 02/05/2017   . Tobacco user 02/05/2017         Past surgical history:  Past Surgical History:   Procedure Laterality Date   . HX BREAST IMPLANT  1999 and 2005   . HX BUNIONECTOMY  2000   . MOUTH SURGERY           Medications:  Current Outpatient Medications   Medication Sig   . ALPRAZolam (XANAX) 0.5 mg Oral Tablet Take 1 Tab (0.5 mg total) by mouth Twice per day as needed for Insomnia   . Ascorbic Acid (VITAMIN C) 1,000 mg Oral Tablet Take 1,000 mg by mouth Once a day   . BIOTIN ORAL Take by mouth   . cefdinir (OMNICEF) 300 mg Oral Capsule Take 2 Caps (600 mg total) by mouth Once a day for 10 days   . Cholecalciferol, Vitamin D3, (VITAMIN D-3) 5,000 unit  Oral Tablet Take by mouth   . CRANBERRY ORAL Take by mouth   . diphenhydramine HCl (BENADRYL ALLERGY ORAL) Take by mouth   . Ibuprofen (MOTRIN) 200 mg Oral Tablet Take 200 mg by mouth Four times a day as needed for Pain   . LORATADINE ORAL Take by mouth   . montelukast (SINGULAIR) 10 mg Oral Tablet Take 1 Tab (10 mg total) by mouth Every evening   . multivitamin Oral Tablet Take 1 Tab by mouth Once a day   . sertraline (ZOLOFT) 100 mg Oral Tablet Take 1 Tab (100 mg total) by mouth Once a day   . temazepam (RESTORIL) 30 mg Oral Capsule Take 1 Cap (30 mg total) by mouth Every night as needed for Insomnia Take 8 hours prior to awake time   . turmeric (CURCUMIN N/A) by Does not apply route     Allergies:  Allergies   Allergen Reactions   . Codeine Nausea/ Vomiting     Family history:  Family Medical History:     Problem Relation (Age of Onset)    Cancer Maternal Grandmother    Coronary Artery Disease Maternal Grandfather    No Known Problems Mother, Father            Social history:  Social History     Socioeconomic History   .  Marital status: Single     Spouse name: Not on file   . Number of children: 0   . Years of education: college   . Highest education level: Not on file   Social Needs   . Financial resource strain: Not on file   . Food insecurity - worry: Not on file   . Food insecurity - inability: Not on file   . Transportation needs - medical: Not on file   . Transportation needs - non-medical: Not on file   Occupational History   . Occupation: MicrobiologistCPA     Employer: [employer]   Tobacco Use   . Smoking status: Current Every Day Smoker     Packs/day: 0.50   . Smokeless tobacco: Never Used   Substance and Sexual Activity   . Alcohol use: No   . Drug use: Not on file   . Sexual activity: Not on file   Other Topics Concern   . Not on file   Social History Narrative    No exercise regimen. Eats mostly home-prepared meals, dines out occasionally. Drinks mostly water. Wears seatbelts.        Physical  Exam:    Vital signs:   Vitals:    07/08/17 1627   BP: 108/78   Pulse: 70   Temp: 36.9 C (98.5 F)   SpO2: 99%   Weight: 60.8 kg (134 lb)   Height: 1.626 m (5\' 4" )   BMI: 23.05         Body mass index is 23 kg/m.    General: Well-appearing. No apparent distress. Does not appear toxic.  Eyes: Normal lids and lashes. Normal conjunctiva.   ENT: Ears: Left TM dull, Left TM red, Left EAC slightly red and tender, right TM and EAC are normal.. Nose: Mild nasal coryza and yellow/green nasal drainage Throat: Clear, No exudate  Neck:  Supple, no cervical lymphadenopathy  Cardiovascular: Regular rate and rhythm. Normal S1 and S2. No murmurs rubs or gallops were auscultated.  Lungs: clear without wheezes, rales or rhonchi.  Skin: Warm and dry. Good skin turgor. No rash is seen.    No results were found from the past 30 days.      Visit Diagnosis  Acute otitis media    Plan  Orders Placed This Encounter   . cefdinir (OMNICEF) 300 mg Oral Capsule     Follow up with PCP or recheck here prn      Hosie Spangleobert Tashara Suder, MD  07/08/2017, 17:34  Electronically signed by Hosie Spangleobert Lakeidra Reliford, MD    This note was partially generated using MModal Fluency Direct system, and there may be some incorrect words, spellings, and punctuation that were not noted in checking the note before saving.

## 2017-07-08 NOTE — Telephone Encounter (Signed)
Pt calls to say her bilateral ears feel full and she is having excruciating pain in both. Has had a low-grade temp of  99.9 today. I advised she be seen at express care. Pt voiced understanding and plans to go now.

## 2017-07-08 NOTE — Patient Instructions (Addendum)
Omnicef antibiotic as directed.  Tylenol or ibuprofen if needed for pain or fever.  Resume Flonase nasal spray until symptoms are improved.  Robitussin DM is available over-the-counter for cough.  Follow-up with PCP or recheck here as needed.    Middle Ear Infection (Adult)  You have an infection of the middle ear, the space behind the eardrum. This is also called acute otitis media (AOM). Sometimes it is caused by the common cold. This is because congestion can block the internal passage (eustachian tube) that drains fluid from the middle ear. When the middle ear fills with fluid, bacteria can grow there and cause an infection. Oral antibiotics are used to treat this illness, not ear drops. Symptoms usually start to improve within 1 to 2 days of treatment.    Home care  The following are general care guidelines:   Finish all of the antibiotic medicine given, even though you may feel better after the first few days.   You may use over-the-counter medicine, such as acetaminophen or ibuprofen, to control pain and fever, unless something else was prescribed. If you have chronic liver or kidney disease or have ever had a stomach ulcer or gastrointestinal bleeding, talk with your healthcare provider before using these medicines. Do not give aspirin to anyone under 49 years of age who has a fever. It may cause severe illness or death.  Follow-up care  Follow up with your healthcare provider, or as advised, in 2 weeks if all symptoms have not gotten better, or if hearing doesn't go back to normal within 1 month.  When to seek medical advice  Call your healthcare provider right away if any of these occur:   Ear pain gets worse or does not improve after 3 days of treatment   Unusual drowsiness or confusion   Neck pain, stiff neck, or headache   Fluid or blood draining from the ear canal   Fever of 100.40F (38C) or as advised   Seizure  Date Last Reviewed: 10/19/2014   2000-2018 [employer] [place] [address], [place], [place] [id number]. All rights reserved. This information is not intended as a substitute for professional medical care. Always follow your healthcare professional's instructions.

## 2017-08-08 ENCOUNTER — Ambulatory Visit (HOSPITAL_BASED_OUTPATIENT_CLINIC_OR_DEPARTMENT_OTHER): Payer: Self-pay | Admitting: Physician Assistant

## 2017-08-08 NOTE — Telephone Encounter (Signed)
Pt calls to say the Restoril isn't working and would like to know if there is an alternative medication she can try. She also reports that Zoloft is working really well! Please advise. Thanks!

## 2017-08-12 ENCOUNTER — Ambulatory Visit (INDEPENDENT_AMBULATORY_CARE_PROVIDER_SITE_OTHER): Admitting: Physician Assistant

## 2017-08-12 ENCOUNTER — Encounter (HOSPITAL_BASED_OUTPATIENT_CLINIC_OR_DEPARTMENT_OTHER): Payer: Self-pay | Admitting: Physician Assistant

## 2017-08-12 VITALS — BP 118/68 | HR 74 | Ht 64.0 in | Wt 137.6 lb

## 2017-08-12 DIAGNOSIS — F419 Anxiety disorder, unspecified: Principal | ICD-10-CM

## 2017-08-12 DIAGNOSIS — G4700 Insomnia, unspecified: Secondary | ICD-10-CM

## 2017-08-12 MED ORDER — SERTRALINE 50 MG TABLET
ORAL_TABLET | ORAL | 1 refills | Status: DC
Start: 2017-08-12 — End: 2017-09-30

## 2017-08-12 MED ORDER — ZALEPLON 5 MG CAPSULE
5.00 mg | ORAL_CAPSULE | Freq: Every evening | ORAL | 5 refills | Status: AC
Start: 2017-08-12 — End: ?

## 2017-08-12 NOTE — Progress Notes (Signed)
Patient Name: Rhonda Griffin  Date: 08/12/2017  [facility]  [address]  [place] [address]  [phone]  MRN: [id number]  DOB: 07-Oct-1970    Chief complaint:   Medication follow up.    HPI:  The patient is a 47 y.o. old female who came in today for f/u. She is doing well. Her anxiety is much better since being on zoloft. She does still get a bit anxious throughout that day though and would like to try to increase the dose a bit. She also reports insomnia which was improved with restoril but it has not been helping recently. She would like to try something different for that. Otherwise she feels well and had no complaints/concerns today.    ROS:  Negative except for those mentioned in HPI       Past medical history:  Past Medical History:   Diagnosis Date   . Scoliosis 02/05/2017   . Tobacco user 02/05/2017         Past surgical history:  Past Surgical History:   Procedure Laterality Date   . HX BREAST IMPLANT  1999 and 2005   . HX BUNIONECTOMY  2000   . MOUTH SURGERY           Medications:  Current Outpatient Medications   Medication Sig   . ALPRAZolam (XANAX) 0.5 mg Oral Tablet Take 1 Tab (0.5 mg total) by mouth Twice per day as needed for Insomnia (Patient not taking: Reported on 08/12/2017)   . Ascorbic Acid (VITAMIN C) 1,000 mg Oral Tablet Take 1,000 mg by mouth Once a day   . BIOTIN ORAL Take by mouth   . Cholecalciferol, Vitamin D3, (VITAMIN D-3) 5,000 unit Oral Tablet Take by mouth   . CRANBERRY ORAL Take by mouth   . diphenhydramine HCl (BENADRYL ALLERGY ORAL) Take by mouth   . Ibuprofen (MOTRIN) 200 mg Oral Tablet Take 200 mg by mouth Four times a day as needed for Pain   . LORATADINE ORAL Take by mouth   . multivitamin Oral Tablet Take 1 Tab by mouth Once a day   . sertraline (ZOLOFT) 100 mg Oral Tablet Take 1 Tab (100 mg total) by mouth Once a day   . sertraline (ZOLOFT) 50 mg Oral Tablet Take 25mg  nightly with 100mg  to make 125mg    .  temazepam (RESTORIL) 30 mg Oral Capsule Take 1 Cap (30 mg total) by mouth Every night as needed for Insomnia Take 8 hours prior to awake time   . turmeric (CURCUMIN N/A) by Does not apply route   . zaleplon (SONATA) 5 mg Oral Capsule Take 1 Cap (5 mg total) by mouth Every night     Allergies:  Allergies   Allergen Reactions   . Codeine Nausea/ Vomiting     Family history:  Family Medical History:     Problem Relation (Age of Onset)    Cancer Maternal Grandmother    Coronary Artery Disease Maternal Grandfather    No Known Problems Mother, Father            Social history:  Social History     Socioeconomic History   . Marital status: Single     Spouse name: Not on file   . Number of children: 0   . Years of education: College   . Highest education level: Not on file   Occupational History   . Occupation: Microbiologist:  [employer]   Social Needs   . Financial resource strain: Not on file   . Food insecurity:     Worry: Not on file     Inability: Not on file   . Transportation needs:     Medical: Not on file     Non-medical: Not on file   Tobacco Use   . Smoking status: Current Every Day Smoker     Packs/day: 0.50   . Smokeless tobacco: Never Used   Substance and Sexual Activity   . Alcohol use: No   . Drug use: Not Currently   . Sexual activity: Not on file   Lifestyle   . Physical activity:     Days per week: 0 days     Minutes per session: 0 min   . Stress: Not on file   Relationships   . Social connections:     Talks on phone: Not on file     Gets together: Not on file     Attends religious service: Not on file     Active member of club or organization: Not on file     Attends meetings of clubs or organizations: Not on file     Relationship status: Not on file   . Intimate partner violence:     Fear of current or ex partner: Not on file     Emotionally abused: Not on file     Physically abused: Not on file     Forced sexual activity: Not on file   Other Topics Concern   . Not on file   Social  History Narrative    Living situation: Lives alone.    Nutrition: Eats mostly home-prepared meals, dines out occasionally.    Caffeine use: Drinks mostly water, 2-3 caffeinated drinks daily.    Exercise: No exercise regimen.    Seatbelt use: Wears seatbelts.       Vitals:    08/12/17 1454   BP: 118/68   Pulse: 74   SpO2: 98%   Weight: 62.4 kg (137 lb 9.6 oz)   Height: 1.626 m (5\' 4" )   BMI: 23.67         Body mass index is 23.62 kg/m.    Physical Examination:  General: appears in good health, appears stated age, no distress and vital signs reviewed  Lungs: Clear to auscultation bilaterally.   Cardiovascular: regular rate and rhythm, S1, S2 normal, no murmur, click, rub or gallop  Psychiatric: Normal affect, behavior, memory, thought content, judgement, and speech.        Visit Diagnosis    ICD-10-CM    1. Anxiety F41.9    2. Insomnia, unspecified type G47.00      Orders Placed This Encounter   . zaleplon (SONATA) 5 mg Oral Capsule   . sertraline (ZOLOFT) 50 mg Oral Tablet     Increase zoloft to 125mg  nightly  Will try sonata for sleep. Advised to call if any problems with the medication  No follow-ups on file.       Gladstone Lighteriffany Saddler, PA-C  08/12/2017, 15:32  Electronically signed by Gladstone Lighteriffany Saddler, PA-C

## 2017-08-25 ENCOUNTER — Emergency Department (HOSPITAL_COMMUNITY): Admitting: Radiology

## 2017-08-25 ENCOUNTER — Encounter (HOSPITAL_COMMUNITY): Payer: Self-pay

## 2017-08-25 ENCOUNTER — Emergency Department
Admission: EM | Admit: 2017-08-25 | Discharge: 2017-08-25 | Disposition: A | Discharge status: Home / Self Care | Attending: Internal Medicine | Admitting: Internal Medicine

## 2017-08-25 DIAGNOSIS — S8002XA Contusion of left knee, initial encounter: Secondary | ICD-10-CM | POA: Insufficient documentation

## 2017-08-25 DIAGNOSIS — S76011A Strain of muscle, fascia and tendon of right hip, initial encounter: Secondary | ICD-10-CM | POA: Insufficient documentation

## 2017-08-25 DIAGNOSIS — W010XXA Fall on same level from slipping, tripping and stumbling without subsequent striking against object, initial encounter: Secondary | ICD-10-CM | POA: Insufficient documentation

## 2017-08-25 DIAGNOSIS — Z79899 Other long term (current) drug therapy: Secondary | ICD-10-CM | POA: Insufficient documentation

## 2017-08-25 DIAGNOSIS — F419 Anxiety disorder, unspecified: Secondary | ICD-10-CM | POA: Insufficient documentation

## 2017-08-25 DIAGNOSIS — F17210 Nicotine dependence, cigarettes, uncomplicated: Secondary | ICD-10-CM | POA: Insufficient documentation

## 2017-08-25 DIAGNOSIS — M419 Scoliosis, unspecified: Secondary | ICD-10-CM | POA: Insufficient documentation

## 2017-08-25 MED ORDER — NAPROXEN 500 MG TABLET
500.00 mg | ORAL_TABLET | Freq: Two times a day (BID) | ORAL | 0 refills | Status: AC
Start: 2017-08-25 — End: 2017-08-30

## 2017-08-25 NOTE — ED Provider Notes (Signed)
Emergency Department  Provider Note  HPI - 08/25/2017    Name: Rhonda FillBobbi J Gohman  Age and Gender: 47 y.o. female  Attending: Dr. Yong ChannelJennifer Auxier  APP: Levie HeritageBrandon Haniel Fix PA-C    HPI:    Entered the patient's room for initial exam at 0956 hours.    Rhonda Griffin is a 47 y.o. female  who presents to the ED today for left knee and right hip pain s/p slipping in the bathroom at work. Patient reports that she gets nauseated and vomits when her anxiety gets too high. She reports that she was at work and was going to the bathroom to vomit when she slipped on water in the bathroom. She states that she hit her left knee and twisted her right hip. She denies hitting her head or having any LOC. She states that her boss just wanted her to come and be checked out. She does not believe that anything is wrong stating, "I just fell and and will be sore for a day or two as would anyone." She rates her current discomfort at a 5/10. She does not have any other complaints or concerns at this time.   PCP: Katy Apoouglas Brooks, MD     Location: left knee and right hip  Quality: pain  Onset: JPTA  Severity: 5/10  Timing: constant  Context: patient has been experiencing left knee pain and right hip pain s/p fall    History provided by: patient     Review of Systems:    Constitutional: No fever, chills  Skin: No rashes or lesions  HENT: No sore throat, ear pain, or difficulty swallowing  Eyes: No vision changes, redness, discharge  Cardio: No chest pain, palpitations   Respiratory: No cough, wheezing or SOB  GI:  No nausea or vomiting. No diarrhea or constipation. No abdominal pain  GU:  No dysuria, hematuria, polyuria  MSK: +Left knee and right hip pain. No neck or back pain  Neuro: No numbness, tingling, or weakness.  No headache  All other systems reviewed and are negative, unless commented on in the HPI.       Below information reviewed with patient:   Current Outpatient Medications   Medication Sig   . ALPRAZolam (XANAX) 0.5 mg Oral Tablet tak 1  tab BID PRN anxiety, may take 2 tabs at bedtime PRN insomnia   . Ascorbic Acid (VITAMIN C) 1,000 mg Oral Tablet Take 1,000 mg by mouth Once a day   . BIOTIN ORAL Take by mouth   . Cholecalciferol, Vitamin D3, (VITAMIN D-3) 5,000 unit Oral Tablet Take by mouth   . CRANBERRY ORAL Take by mouth   . diphenhydramine HCl (BENADRYL ALLERGY ORAL) Take by mouth   . Ibuprofen (MOTRIN) 200 mg Oral Tablet Take 200 mg by mouth Four times a day as needed for Pain   . LORATADINE ORAL Take by mouth   . multivitamin Oral Tablet Take 1 Tab by mouth Once a day   . naproxen (NAPROSYN) 500 mg Oral Tablet Take 1 Tab (500 mg total) by mouth Twice daily with food for 5 days   . sertraline (ZOLOFT) 100 mg Oral Tablet Take 1 Tab (100 mg total) by mouth Once a day (Patient not taking: Reported on 08/26/2017)   . sertraline (ZOLOFT) 50 mg Oral Tablet Take 25mg  nightly with 100mg  to make 125mg  (Patient not taking: Reported on 08/26/2017)   . temazepam (RESTORIL) 30 mg Oral Capsule Take 1 Cap (30 mg total) by mouth Every night  as needed for Insomnia Take 8 hours prior to awake time (Patient not taking: Reported on 08/26/2017)   . turmeric (CURCUMIN N/A) by Does not apply route   . zaleplon (SONATA) 5 mg Oral Capsule Take 1 Cap (5 mg total) by mouth Every night (Patient not taking: Reported on 08/26/2017)       Allergies   Allergen Reactions   . Codeine Nausea/ Vomiting          Past Medical History:  Past Medical History:   Diagnosis Date   . Scoliosis 02/05/2017   . Tobacco user 02/05/2017       Past Surgical History:  Past Surgical History:   Procedure Laterality Date   . Hx breast implant  1999 and 2005   . Hx bunionectomy  2000   . Mouth surgery         Social History:  Social History     Tobacco Use   . Smoking status: Current Every Day Smoker     Packs/day: 0.50   . Smokeless tobacco: Never Used   Substance Use Topics   . Alcohol use: No   . Drug use: Not Currently     Social History     Substance and Sexual Activity   Drug Use Not Currently              Family History:  Family History   Problem Relation Age of Onset   . No Known Problems Mother    . No Known Problems Father    . Cancer Maternal Grandmother         uterine   . Coronary Artery Disease Maternal Grandfather          Old records reviewed.      Objective:  Nursing notes reviewed    Filed Vitals:    08/25/17 1003   BP: 95/60   Pulse: 63   Resp: 20   Temp: 36.6 C (97.8 F)   SpO2: 96%       Physical Exam  Nursing note and vitals reviewed.  Vital signs reviewed as above.     Constitutional: Pt is well-developed and well-nourished.   Head: Normocephalic and atraumatic.   Neck: Soft, supple, full range of motion.  Cardiovascular: RRR. No Murmurs/rubs/gallops. Distal pulses present and equal bilaterally.  Pulmonary/Chest: Normal BS BL with no distress. No audible wheezes or crackles are noted.  Musculoskeletal: +Mild right hip and left knee tenderness with a painful ROM. No deformities. Exhibits no edema.   Skin: Warm and dry. No rash or lesions    Work-up:  Orders Placed This Encounter   . XR KNEE LEFT 3 V   . XR HIP RIGHT W PELVIS 2-3 VIEWS   . naproxen (NAPROSYN) 500 mg Oral Tablet        Labs:  No results found for this or any previous visit (from the past 24 hour(s)).    Imaging:     Results for orders placed or performed during the hospital encounter of 08/25/17 (from the past 72 hour(s))   XR HIP RIGHT W PELVIS 2-3 VIEWS     Status: None    Narrative    EXAMINATION: Right hip with pelvis    HISTORY: Fall with right hip injury/pain.      AP view of the pelvis was performed along with a frog-leg lateral view of  the right hip. Both hips are normally located. No acute fracture or  destructive bony process is seen. Hip joints are fairly  well-maintained.  There is no evidence of a significant arthritic process.      Impression    1. Grossly normal evaluation of the pelvis and right hip.        Radiologist location ID: [id number]     XR KNEE LEFT 3 V     Status: None    Narrative    EXAMINATION: Left knee  3 views    HISTORY: Fall with left knee injury.      3 views of the knee were obtained, including frontal, oblique, and  crosstable lateral views. Bone mineralization is normal. No acute fracture  is identified. No destructive bony lesions are seen. Joint spaces are well  maintained. There is no significant joint effusion. Only minimal  degenerative changes are present.      Impression    1. No evidence of an acute bony injury.        Radiologist location ID: [id number]         Abnormal Lab results:  Labs Reviewed - No data to display    Plan: Appropriate labs and imaging ordered. Medical Records reviewed.    MDM:   During the patient's stay in the emergency department, the above listed information was used to assist with medical decision making and were reviewed by myself when available for review.       Pt remained stable throughout the emergency department course.      Impression:   Encounter Diagnoses   Name Primary?   . Contusion of left knee Yes   . Hip strain, right, initial encounter      Disposition:  Discharged   Medication instructions were discussed with the patient   It was advised that the patient return to the ED with any new, concerning or worsening symptoms and follow up as directed.    The patient verbalized understanding of all instructions and had no further questions or concerns.     Follow up:   Katy Apo, MD  [address]  [address]  [place] [address]  [phone]      As needed      Prescriptions:   Discharge Medication List as of 08/25/2017 11:49 AM      START taking these medications    Details   naproxen (NAPROSYN) 500 mg Oral Tablet Take 1 Tab (500 mg total) by mouth Twice daily with food for 5 days, Disp-10 Tab, R-0, Print             Mickey Farber, SCRIBE  08/25/2017, 10:03    I personally performed the services described in this documentation, as scribed in my presence, and it is both accurate and complete.   Levie Heritage, PA-C  The co-signing faculty was physically present  in the emergency department and available for consultation and did not participate in the care of this patient.    Levie Heritage, PA-C  08/26/2017, 12:49

## 2017-08-25 NOTE — ED Triage Notes (Signed)
Pt reports she was at work and needed to vomit, so she went to the bathroom and slipped and fell. Pt reports she has anxiety and vomits from this - that this is nothing abnormal. Pt states she saw water on the floor and thinks she slipped from hurrying. Pt offered workers comp paperwork because of her injury taking place at work.

## 2017-08-26 ENCOUNTER — Ambulatory Visit (INDEPENDENT_AMBULATORY_CARE_PROVIDER_SITE_OTHER): Admitting: Physician Assistant

## 2017-08-26 ENCOUNTER — Encounter (HOSPITAL_BASED_OUTPATIENT_CLINIC_OR_DEPARTMENT_OTHER): Payer: Self-pay | Admitting: Physician Assistant

## 2017-08-26 VITALS — BP 103/66 | HR 73 | Ht 64.0 in | Wt 134.0 lb

## 2017-08-26 DIAGNOSIS — F419 Anxiety disorder, unspecified: Secondary | ICD-10-CM

## 2017-08-26 MED ORDER — ALPRAZOLAM 0.5 MG TABLET
ORAL_TABLET | ORAL | 0 refills | Status: AC
Start: 2017-08-26 — End: ?

## 2017-08-26 NOTE — Progress Notes (Signed)
Patient Name: Rhonda Griffin  Date: 08/26/2017  [facility], [facility]  [address]  [place] [address]  [id number]  MRN: [id number]  DOB: 1971-05-20    Chief complaint:   Anxiety.    HPI:  The patient is a 47 y.o. old female who came in today for c/o anxiety due to work stressors as indicated in previous OV notes. She had interviewed for another job out of state but ultimately did not get that job and is currently very upset about having to stay in her current position indefinitely. She does have several other applications in to other companies. She is very anxious today about having to go in to work and reports that the only peace she gets is when she goes to sleep. She does feel that the zoloft is helping and also reports that the xanax is helping. She did not feel that the sonata helped with her sleep but she has started taking 1mg  xanax QHS which helps her sleep. She would like a refill on that. (S)he has no other complaints or concerns today.      ROS:  Negative except for those mentioned in HPI       Past medical history:  Past Medical History:   Diagnosis Date   . Scoliosis 02/05/2017   . Tobacco user 02/05/2017         Past surgical history:  Past Surgical History:   Procedure Laterality Date   . HX BREAST IMPLANT  1999 and 2005   . HX BUNIONECTOMY  2000   . MOUTH SURGERY           Medications:  Current Outpatient Medications   Medication Sig   . ALPRAZolam (XANAX) 0.5 mg Oral Tablet tak 1 tab BID PRN anxiety, may take 2 tabs at bedtime PRN insomnia   . Ascorbic Acid (VITAMIN C) 1,000 mg Oral Tablet Take 1,000 mg by mouth Once a day   . BIOTIN ORAL Take by mouth   . Cholecalciferol, Vitamin D3, (VITAMIN D-3) 5,000 unit Oral Tablet Take by mouth   . CRANBERRY ORAL Take by mouth   . diphenhydramine HCl (BENADRYL ALLERGY ORAL) Take by mouth   . Ibuprofen (MOTRIN) 200 mg Oral Tablet Take 200 mg by mouth Four times a day as needed for Pain   . LORATADINE  ORAL Take by mouth   . multivitamin Oral Tablet Take 1 Tab by mouth Once a day   . naproxen (NAPROSYN) 500 mg Oral Tablet Take 1 Tab (500 mg total) by mouth Twice daily with food for 5 days   . sertraline (ZOLOFT) 100 mg Oral Tablet Take 1 Tab (100 mg total) by mouth Once a day (Patient not taking: Reported on 08/26/2017)   . sertraline (ZOLOFT) 50 mg Oral Tablet Take 25mg  nightly with 100mg  to make 125mg  (Patient not taking: Reported on 08/26/2017)   . temazepam (RESTORIL) 30 mg Oral Capsule Take 1 Cap (30 mg total) by mouth Every night as needed for Insomnia Take 8 hours prior to awake time (Patient not taking: Reported on 08/26/2017)   . turmeric (CURCUMIN N/A) by Does not apply route   . zaleplon (SONATA) 5 mg Oral Capsule Take 1 Cap (5 mg total) by mouth Every night (Patient not taking: Reported on 08/26/2017)     Allergies:  Allergies   Allergen Reactions   . Codeine Nausea/ Vomiting     Family history:  Family Medical History:  Problem Relation (Age of Onset)    Cancer Maternal Grandmother    Coronary Artery Disease Maternal Grandfather    No Known Problems Mother, Father            Social history:  Social History     Socioeconomic History   . Marital status: Single     Spouse name: Not on file   . Number of children: 0   . Years of education: College   . Highest education level: Not on file   Occupational History   . Occupation: MicrobiologistCPA     Employer: [employer]   Social Needs   . Financial resource strain: Not on file   . Food insecurity:     Worry: Not on file     Inability: Not on file   . Transportation needs:     Medical: Not on file     Non-medical: Not on file   Tobacco Use   . Smoking status: Current Every Day Smoker     Packs/day: 0.50   . Smokeless tobacco: Never Used   Substance and Sexual Activity   . Alcohol use: No   . Drug use: Not Currently   . Sexual activity: Not on file   Lifestyle   . Physical activity:     Days per week: 0 days     Minutes per session: 0 min   . Stress: Not on file     Relationships   . Social connections:     Talks on phone: Not on file     Gets together: Not on file     Attends religious service: Not on file     Active member of club or organization: Not on file     Attends meetings of clubs or organizations: Not on file     Relationship status: Not on file   . Intimate partner violence:     Fear of current or ex partner: Not on file     Emotionally abused: Not on file     Physically abused: Not on file     Forced sexual activity: Not on file   Other Topics Concern   . Not on file   Social History Narrative    Living situation: Lives alone.    Nutrition: Eats mostly home-prepared meals, dines out occasionally.    Caffeine use: Drinks mostly water, 2-3 caffeinated drinks daily.    Exercise: No exercise regimen.    Seatbelt use: Wears seatbelts.       Vitals:    08/26/17 0905   BP: 103/66   Pulse: 73   SpO2: 99%   Weight: 60.8 kg (134 lb)   Height: 1.626 m (5\' 4" )   BMI: 23.05         Body mass index is 23 kg/m.    Physical Examination:  General: appears in good health, appears stated age, no distress and vital signs reviewed  Psychiatric: Anxious, No suicidal or homicidal ideations, Behavior normal, Memory normal, Thought content normal, Judgement normal, Speech normal        Visit Diagnosis    ICD-10-CM    1. Anxiety F41.9      Orders Placed This Encounter   . ALPRAZolam (XANAX) 0.5 mg Oral Tablet     Refill xanax. Advised to take PRN and may take up to 1mg  QHS PRN for anxiety. Continue zoloft. Recommended pt going to counseling for this.   No follow-ups on file.  Gladstone Lighter, PA-C  08/26/2017, 09:33  Electronically signed by Gladstone Lighter, PA-C

## 2017-09-29 ENCOUNTER — Other Ambulatory Visit (HOSPITAL_BASED_OUTPATIENT_CLINIC_OR_DEPARTMENT_OTHER): Payer: Self-pay | Admitting: FAMILY MEDICINE

## 2017-09-29 ENCOUNTER — Other Ambulatory Visit (HOSPITAL_BASED_OUTPATIENT_CLINIC_OR_DEPARTMENT_OTHER): Payer: Self-pay | Admitting: Physician Assistant

## 2017-09-29 ENCOUNTER — Ambulatory Visit (HOSPITAL_BASED_OUTPATIENT_CLINIC_OR_DEPARTMENT_OTHER): Payer: Self-pay | Admitting: FAMILY MEDICINE

## 2017-09-29 DIAGNOSIS — F410 Panic disorder [episodic paroxysmal anxiety] without agoraphobia: Secondary | ICD-10-CM

## 2017-09-29 NOTE — Telephone Encounter (Signed)
Regarding: Dr. Brooks  -----Shon Batonssage from Aneta Mins sent at 09/29/2017  8:28 AM EDT -----  Pt is requesting a referral to see Dr. Verna Czech Psychiatry at [facility]

## 2017-09-29 NOTE — Telephone Encounter (Signed)
OK.  I'll print.

## 2017-09-29 NOTE — Telephone Encounter (Signed)
DB please see below. Thanks

## 2017-09-30 ENCOUNTER — Ambulatory Visit (HOSPITAL_BASED_OUTPATIENT_CLINIC_OR_DEPARTMENT_OTHER): Payer: Self-pay | Admitting: FAMILY MEDICINE

## 2017-09-30 NOTE — Telephone Encounter (Signed)
Regarding: Dr. Shon Baton  ----- Message from Aneta Mins sent at 09/30/2017 10:23 AM EDT -----  Refill [facility] on [address]    sertraline (ZOLOFT) 100 mg Oral Tablet, Take 1 Tab (100 mg total) by mouth Once a day (Patient not taking: Reported on 08/26/2017)

## 2017-09-30 NOTE — Telephone Encounter (Signed)
Please see med refill request, pt's LOV 08/26/17. Thanks!

## 2017-09-30 NOTE — Telephone Encounter (Signed)
Duplicate request, please see e-mail dated 09/29/17. Thanks.

## 2018-01-23 ENCOUNTER — Ambulatory Visit: Payer: Federal, State, Local not specified - PPO | Admitting: Nurse Practitioner

## 2018-01-23 ENCOUNTER — Encounter: Payer: Self-pay | Admitting: Nurse Practitioner

## 2018-01-23 ENCOUNTER — Other Ambulatory Visit: Payer: Self-pay

## 2018-01-23 VITALS — BP 105/66 | HR 68 | Temp 98.8°F | Ht 64.0 in | Wt 130.2 lb

## 2018-01-23 DIAGNOSIS — F419 Anxiety disorder, unspecified: Secondary | ICD-10-CM | POA: Diagnosis not present

## 2018-01-23 DIAGNOSIS — Z7689 Persons encountering health services in other specified circumstances: Secondary | ICD-10-CM | POA: Diagnosis not present

## 2018-01-23 DIAGNOSIS — F5101 Primary insomnia: Secondary | ICD-10-CM

## 2018-01-23 MED ORDER — SERTRALINE HCL 100 MG PO TABS: 100.0000 mg | ORAL_TABLET | Freq: Every day | ORAL | 1 refills | Status: DC

## 2018-01-23 MED ORDER — TEMAZEPAM 15 MG PO CAPS: 15.0000 mg | ORAL_CAPSULE | Freq: Every evening | ORAL | 1 refills | Status: DC | PRN

## 2018-01-23 NOTE — Progress Notes (Signed)
Subjective:    Patient ID: Sharon Ponce, female    DOB: 03/25/1971, 47 y.o.   MRN: [id number]  Sharon Ponce is a 47 y.o. female presenting on 01/23/2018 for Establish Care (need refill medication)   HPI Establish Care New Provider Pt last seen by PCP about 3-6 months ago in [place].  Obtain records.    Anxiety Has been anxious person and has had insomnia 2/2 fear of tardiness. Workplace environment was not supportive and contributed to significant anxiety.  Has changed circumstance and has had reduction of anxiety and help with zoloft tremendously.  Desires to continue, but may decrease in future as situational anxiety is reduced.  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  Patient has tried many sleep medications including melatonin, valerian, tryptophan, benadryl, Sonata, Ambien (felt "in another universe" cannot sleep for feeling weird).  She was then started on Restoril with significant relief of insomnia.  She has continued this medication for several years and was recently at a new provider who tried her with Trazodone.  Patient did not like the quick onset of medication or how it made her feel.  She was immediately off balance and stumbling to 6-8 hrs after taking and no sleep.    Restoril: Patient tries to take only during the week and not take on weekends.  Has had to take on weekends for last month with busyness of relocation.  Falls asleep around 3-4 am when not taking this and only sleeps about 3 hours.  Was on 30 mg originally, reduced to 22.5 mg and is more expensive.  Works just as well for patient at lower dose.  Patient desires to continue at either 30 mg or 15 mg which are both $10 per month.  Desires solid 5 hours for mostly restful awakening, which she is able to accomplish with Restoril. Sleep duration of 7 hours feels best, but only happens about 1x per month.   Smoking  cigarettes: 5-6 cigs a day in am and evening.  Has significantly cut back and does desire to quit in future.  Past Medical History:  Diagnosis Date  . Allergy   . Anxiety    Past Surgical History:  Procedure Laterality Date  . BUNIONECTOMY    . PLACEMENT OF BREAST IMPLANTS       Social History   Socioeconomic History  . Marital status: Not on file    Spouse name: Not on file  . Number of children: Not on file  . Years of education: Not on file  . Highest education level: Master's degree (e.g., MA, MS, MEng, MEd, MSW, MBA)  Occupational History  . Occupation: Agricultural engineer: [employer]  Social Needs  . Financial resource strain: Not hard at all  . Food insecurity:    Worry: Never true    Inability: Never true  . Transportation needs:    Medical: No    Non-medical: No  Tobacco Use  . Smoking status: Current Every Day Smoker    Packs/day: 0.25    Years: 20.00    Pack years: 5.00  . Smokeless tobacco: Never Used  Substance and Sexual Activity  . Alcohol use: Never    Frequency: Never  . Drug use: Never  . Sexual activity: Not Currently    Birth control/protection: None  Lifestyle  . Physical activity:    Days per week: 4 days    Minutes per  session: 50 min  . Stress: Only a little  Relationships  . Social connections:    Talks on phone: Not on file    Gets together: Not on file    Attends religious service: Not on file    Active member of club or organization: Not on file    Attends meetings of clubs or organizations: Not on file    Relationship status: Not on file  . Intimate partner violence:    Fear of current or ex partner: Not on file    Emotionally abused: Not on file    Physically abused: Not on file    Forced sexual activity: Not on file  Other Topics Concern  . Not on file  Social History Narrative  . Not on file   Family History  Problem Relation Age of Onset  . Healthy Mother   . Healthy Father   . Leukemia Maternal Grandmother   . Heart  disease Maternal Grandfather   . Breast cancer Neg Hx   . Colon cancer Neg Hx   . Stroke Neg Hx    Current Outpatient Medications on File Prior to Visit  Medication Sig  . Multiple Vitamin (MULTIVITAMIN) tablet Take 1 tablet by mouth daily.  . Omega-3 Fatty Acids (OMEGA-3 FISH OIL PO) Take by mouth.  . Potassium (POTASSIMIN PO) Take by mouth.  . Turmeric (CURCUMIN 95 PO) Take by mouth.  . vitamin C (ASCORBIC ACID) 500 MG tablet Take 500 mg by mouth daily.   No current facility-administered medications on file prior to visit.     Review of Systems  Constitutional: Negative for chills and fever.  HENT: Negative for congestion and sore throat.   Eyes: Negative for pain.  Respiratory: Negative for cough, shortness of breath and wheezing.   Cardiovascular: Negative for chest pain, palpitations and leg swelling.  Gastrointestinal: Negative for abdominal pain, blood in stool, constipation, diarrhea, nausea and vomiting.  Endocrine: Negative for polydipsia.  Genitourinary: Negative for dysuria, frequency, hematuria and urgency.  Musculoskeletal: Negative for back pain, myalgias and neck pain.  Skin: Negative.  Negative for rash.  Allergic/Immunologic: Negative for environmental allergies.  Neurological: Negative for dizziness, weakness and headaches.  Hematological: Does not bruise/bleed easily.  Psychiatric/Behavioral: Positive for sleep disturbance. Negative for dysphoric mood and suicidal ideas. The patient is nervous/anxious.    Per HPI unless specifically indicated above     Objective:    BP 105/66 (BP Location: Right Arm, Patient Position: Sitting, Cuff Size: Normal)   Pulse 68   Temp 98.8 F (37.1 C) (Oral)   Ht 5\' 4"  (1.626 m)   Wt 130 lb 3.2 oz (59.1 kg)   BMI 22.35 kg/m   Wt Readings from Last 3 Encounters:  01/23/18 130 lb 3.2 oz (59.1 kg)    Physical Exam  Constitutional: She is oriented to person, place, and time. She appears well-developed and well-nourished. No  distress.  HENT:  Head: Normocephalic and atraumatic.  Cardiovascular: Normal rate, regular rhythm, S1 normal, S2 normal, normal heart sounds and intact distal pulses.  Pulmonary/Chest: Effort normal and breath sounds normal. No respiratory distress.  Neurological: She is alert and oriented to person, place, and time.  No tremor noted  Skin: Skin is warm and dry. Capillary refill takes less than 2 seconds.  Psychiatric: She has a normal mood and affect. Her behavior is normal. Judgment and thought content normal.  Vitals reviewed.     Assessment & Plan:   Problem List Items Addressed This Visit  None    Visit Diagnoses    Anxiety    -  Primary Stable and improving on sertraline 100 mg once daily and with change in situation with job and relocation.  Refills provided. Followup 6 weeks with annual physical.  Consider reducing dose in future if anxiety continues to improve.   Relevant Medications   sertraline (ZOLOFT) 100 MG tablet   Encounter to establish care     Previous PCP was in [place] about 3 months ago.  Records will be requested.  Past medical, family, and surgical history reviewed w/ patient in clinic today.     Primary insomnia     Stable on restoril 22.5 mg once daily.  Patient had previously reduced dose.  States initial dose was at 30 mg at bedtime and is willing to continue reducing dose.  Multiple attempted medications with failure in past 2/2 side effects and lack of efficacy.   Reduce dose.  Take temazepam 15 mg once daily.  Reviewed controlled substance.  Signed controlled substance(non-opioid) contract.  Consider sleep medicine in future if needed.  Continue taking weekend drug holidays to prevent dependency and tolerance of benzo medication. Followup 6 weeks with annual physical or sooner if needed for non-effective dose change.    Relevant Medications   temazepam (RESTORIL) 15 MG capsule   sertraline (ZOLOFT) 100 MG tablet      Meds ordered this encounter    Medications  . temazepam (RESTORIL) 15 MG capsule    Sig: Take 1 capsule (15 mg total) by mouth at bedtime as needed for sleep.    Dispense:  30 capsule    Refill:  1    Run as cash pay for patient please.    Order Specific Question:   Supervising Provider    Answer:   Smitty Cords [[id number]]  . sertraline (ZOLOFT) 100 MG tablet    Sig: Take 1 tablet (100 mg total) by mouth daily.    Dispense:  90 tablet    Refill:  1    Order Specific Question:   Supervising Provider    Answer:   Smitty Cords [[id number]]    Follow up plan: Return in about 6 weeks (around 03/06/2018) for annual physical.  Wilhelmina Mcardle, DNP, AGPCNP-BC Adult Gerontology Primary Care Nurse Practitioner [facility] 01/23/2018, 2:55 PM

## 2018-01-23 NOTE — Patient Instructions (Addendum)
Sharon Ponce,   Thank you for coming in to clinic today.  Continue temazepam - REDUCE to 15 mg once daily  Continue Zoloft 100 mg once daily.   1. Your provider would like to you have your annual eye exam. Please contact your current eye doctor or here are some good options for you to contact.   [facility]   Address: [address] [place], [place] [address] Phone: [phone]  Website: [email or web address]   [facility] [address] [place], [place] [address] Phone: [phone] [email or web address]  [facility]  Address: [address] [place], [place], [place] [address] Phone: [phone]   [facility] [address] [place], [place] [address] Phone: [phone]  [facility] Address: [address] [place], [place], [place] [address]  Phone: [phone]   2. Dental Care: look for your in-network providers, but here are 2 local dentists. [facility] Address: [address] [place], [place] [address]  Phone: [phone]   Leander Rams, DDS Address: [address], [place], [place] [address]  Phone: [phone]   Please schedule a follow-up appointment with Wilhelmina Mcardle, AGNP. Return in about 6 weeks (around 03/06/2018) for annual physical.  If you have any other questions or concerns, please feel free to call the clinic or send a message through MyChart. You may also schedule an earlier appointment if necessary.  You will receive a survey after today's visit either digitally by e-mail or paper by [employer]. Your experiences and feedback matter to [place].  Please respond so we know how we are doing as we provide care for you.   Wilhelmina Mcardle, DNP, AGNP-BC Adult Gerontology Nurse Practitioner [facility], [facility]

## 2018-03-02 ENCOUNTER — Encounter: Payer: Federal, State, Local not specified - PPO | Admitting: Nurse Practitioner

## 2018-03-06 ENCOUNTER — Encounter: Payer: Federal, State, Local not specified - PPO | Admitting: Nurse Practitioner

## 2018-03-23 ENCOUNTER — Encounter: Payer: Federal, State, Local not specified - PPO | Admitting: Nurse Practitioner

## 2018-03-30 ENCOUNTER — Encounter: Payer: Federal, State, Local not specified - PPO | Admitting: Nurse Practitioner

## 2018-04-06 ENCOUNTER — Encounter: Payer: Self-pay | Admitting: Nurse Practitioner

## 2018-04-06 ENCOUNTER — Ambulatory Visit (INDEPENDENT_AMBULATORY_CARE_PROVIDER_SITE_OTHER): Payer: Federal, State, Local not specified - PPO | Admitting: Nurse Practitioner

## 2018-04-06 ENCOUNTER — Other Ambulatory Visit: Payer: Self-pay

## 2018-04-06 ENCOUNTER — Other Ambulatory Visit (HOSPITAL_COMMUNITY)
Admission: RE | Admit: 2018-04-06 | Discharge: 2018-04-06 | Disposition: A | Discharge status: Home / Self Care | Payer: Federal, State, Local not specified - PPO | Source: Ambulatory Visit | Attending: Nurse Practitioner | Admitting: Nurse Practitioner

## 2018-04-06 VITALS — BP 103/56 | HR 57 | Temp 98.5°F | Ht 64.0 in | Wt 134.6 lb

## 2018-04-06 DIAGNOSIS — Z114 Encounter for screening for human immunodeficiency virus [HIV]: Secondary | ICD-10-CM

## 2018-04-06 DIAGNOSIS — Z124 Encounter for screening for malignant neoplasm of cervix: Secondary | ICD-10-CM

## 2018-04-06 DIAGNOSIS — Z131 Encounter for screening for diabetes mellitus: Secondary | ICD-10-CM | POA: Diagnosis not present

## 2018-04-06 DIAGNOSIS — Z0000 Encounter for general adult medical examination without abnormal findings: Secondary | ICD-10-CM | POA: Diagnosis not present

## 2018-04-06 NOTE — Progress Notes (Signed)
Subjective:    Patient ID: Sharon Ponce, female    DOB: 07-Jul-1970, 47 y.o.   MRN: [id number]  Sharon Ponce is a 47 y.o. female presenting on 04/06/2018 for Annual Exam (sinus pressure mostly in the frontal lobe x 1 week )   HPI Annual Physical Exam Patient has been feeling well.  They have acute concerns today for sinus pressure, which patient states is normal this time of the year. Sleeps 6-7 hours per night uninterrupted when taking sleep medications.  Off medication for sleep gets about 4-5 hours and frequently interrupted.  HEALTH MAINTENANCE: Weight/BMI: stable Physical activity: staying physically active, back to peloton routine Diet: generally healthy - travelling for work is worsening. Seatbelt: regularly Sunscreen: regularly PAP: due Mammogram: defers to age 73 DEXA: never Colon Cancer Screen: defers  HIV: due Optometry: regular Dentistry: regular  VACCINES: Tetanus: Last around 2008 - due Influenza: declines  Sinus pressure Increased pressure, drainage begins and then has infection. - Doesn't want this to become an infection.   Past Medical History:  Diagnosis Date  . Allergy   . Anxiety    Past Surgical History:  Procedure Laterality Date  . BUNIONECTOMY    . PLACEMENT OF BREAST IMPLANTS     Social History   Socioeconomic History  . Marital status: Single    Spouse name: Not on file  . Number of children: Not on file  . Years of education: Not on file  . Highest education level: Master's degree (e.g., MA, MS, MEng, MEd, MSW, MBA)  Occupational History  . Occupation: Agricultural engineer: [employer]  Social Needs  . Financial resource strain: Not hard at all  . Food insecurity:    Worry: Never true    Inability: Never true  . Transportation needs:    Medical: No    Non-medical: No  Tobacco Use  . Smoking status: Current Every Day Smoker    Packs/day: 0.25    Years: 20.00    Pack years: 5.00  . Smokeless tobacco: Never Used  Substance and  Sexual Activity  . Alcohol use: Never    Frequency: Never  . Drug use: Never  . Sexual activity: Not Currently    Birth control/protection: None  Lifestyle  . Physical activity:    Days per week: 4 days    Minutes per session: 50 min  . Stress: Only a little  Relationships  . Social connections:    Talks on phone: Not on file    Gets together: Not on file    Attends religious service: Not on file    Active member of club or organization: Not on file    Attends meetings of clubs or organizations: Not on file    Relationship status: Not on file  . Intimate partner violence:    Fear of current or ex partner: Not on file    Emotionally abused: Not on file    Physically abused: Not on file    Forced sexual activity: Not on file  Other Topics Concern  . Not on file  Social History Narrative  . Not on file   Family History  Problem Relation Age of Onset  . Healthy Mother   . Healthy Father   . Leukemia Maternal Grandmother   . Heart disease Maternal Grandfather   . Breast cancer Neg Hx   . Colon cancer Neg Hx   . Stroke Neg Hx    Current Outpatient Medications on File Prior to Visit  Medication  Sig  . Multiple Vitamin (MULTIVITAMIN) tablet Take 1 tablet by mouth daily.  . Omega-3 Fatty Acids (OMEGA-3 FISH OIL PO) Take by mouth.  . Potassium (POTASSIMIN PO) Take by mouth.  . sertraline (ZOLOFT) 100 MG tablet Take 1 tablet (100 mg total) by mouth daily.  . temazepam (RESTORIL) 15 MG capsule Take 1 capsule (15 mg total) by mouth at bedtime as needed for sleep.  . Turmeric (CURCUMIN 95 PO) Take by mouth.  . vitamin C (ASCORBIC ACID) 500 MG tablet Take 500 mg by mouth daily.   No current facility-administered medications on file prior to visit.     Review of Systems  Constitutional: Negative for chills and fever.  HENT: Positive for congestion, postnasal drip, rhinorrhea, sinus pressure and sinus pain. Negative for sore throat.   Eyes: Negative for pain.  Respiratory:  Negative for cough, shortness of breath and wheezing.   Cardiovascular: Negative for chest pain, palpitations and leg swelling.  Gastrointestinal: Negative for abdominal pain, blood in stool, constipation, diarrhea, nausea and vomiting.  Endocrine: Negative for polydipsia.  Genitourinary: Negative for dysuria, frequency, hematuria and urgency.  Musculoskeletal: Negative for back pain, myalgias and neck pain.  Skin: Negative.  Negative for rash.  Allergic/Immunologic: Negative for environmental allergies.  Neurological: Negative for dizziness, weakness and headaches.  Hematological: Does not bruise/bleed easily.  Psychiatric/Behavioral: Negative for dysphoric mood and suicidal ideas. The patient is not nervous/anxious.    Per HPI unless specifically indicated above     Objective:    BP (!) 103/56 (BP Location: Right Arm, Patient Position: Sitting, Cuff Size: Normal)   Pulse (!) 57   Temp 98.5 F (36.9 C) (Oral)   Ht 5\' 4"  (1.626 m)   Wt 134 lb 9.6 oz (61.1 kg)   LMP 03/28/2018   BMI 23.10 kg/m   Wt Readings from Last 3 Encounters:  04/06/18 134 lb 9.6 oz (61.1 kg)  01/23/18 130 lb 3.2 oz (59.1 kg)    Physical Exam  Constitutional: She is oriented to person, place, and time. She appears well-developed and well-nourished. No distress.  HENT:  Head: Normocephalic and atraumatic.  Right Ear: External ear normal.  Left Ear: External ear normal.  Nose: Nose normal.  Mouth/Throat: Oropharynx is clear and moist.  Eyes: Pupils are equal, round, and reactive to light. Conjunctivae are normal.  Neck: Normal range of motion. Neck supple. No JVD present. No tracheal deviation present. No thyromegaly present.  Cardiovascular: Normal rate, regular rhythm, normal heart sounds and intact distal pulses. Exam reveals no gallop and no friction rub.  No murmur heard. Pulmonary/Chest: Effort normal and breath sounds normal. No respiratory distress.  Abdominal: Soft. Bowel sounds are normal. She  exhibits no distension. There is no hepatosplenomegaly. There is no tenderness.  Genitourinary:  Genitourinary Comments: Normal external female genitalia without lesions or fusion. Vaginal canal without lesions. Normal appearing cervix without lesions or friability. Physiologic discharge on exam. Bimanual exam without adnexal masses, enlarged uterus, or cervical motion tenderness.  Breast - Normal exam w/ symmetric breasts, no mass, no nipple discharge, no skin changes or tenderness.  Breast implants present.  Musculoskeletal: Normal range of motion.  Lymphadenopathy:    She has no cervical adenopathy.  Neurological: She is alert and oriented to person, place, and time. No cranial nerve deficit.  Skin: Skin is warm and dry. Capillary refill takes less than 2 seconds.  Psychiatric: She has a normal mood and affect. Her behavior is normal. Judgment and thought content normal.  Nursing  note and vitals reviewed.   Results for orders placed or performed in visit on 04/06/18  Cytology - PAP  Result Value Ref Range   Adequacy      Satisfactory for evaluation  endocervical/transformation zone component PRESENT.   Diagnosis      NEGATIVE FOR INTRAEPITHELIAL LESIONS OR MALIGNANCY.   HPV NOT DETECTED    Material Submitted CervicoVaginal Pap [ThinPrep Imaged]    CYTOLOGY - PAP PAP RESULT       Assessment & Plan:   Problem List Items Addressed This Visit    None    Visit Diagnoses    Annual physical exam    -  Primary   Relevant Orders   CBC with Differential/Platelet   COMPLETE METABOLIC PANEL WITH GFR   Hemoglobin A1c   Lipid panel   TSH   HIV Antibody (routine testing w rflx)   Diabetes mellitus screening       Relevant Orders   Hemoglobin A1c   Screening for HIV without presence of risk factors       Relevant Orders   HIV Antibody (routine testing w rflx)   Cervical cancer screening       Relevant Orders   Cytology - PAP (Completed)    Annual physical exam with no new  findings.  Well adult with no acute concerns.  Plan: 1. Obtain health maintenance screenings as above according to age. - Increase physical activity to 30 minutes most days of the week.  - Eat healthy diet high in vegetables and fruits; low in refined carbohydrates. - Breast cancer screening and cervical cancer screening as above. - labs as above for routine screenings. 2. Return 1 year for annual physical.     Follow up plan: Return in about 1 year (around 04/07/2019) for annual physical AND in about 6 weeks for depression.  Wilhelmina McardleLauren Shoshana Johal, DNP, AGPCNP-BC Adult Gerontology Primary Care Nurse Practitioner [facility] 04/06/2018, 2:52 PM

## 2018-04-06 NOTE — Patient Instructions (Addendum)
Lyda PeroneBobbi Ruttan,   Thank you for coming in to clinic today.  1. For sinus pressure: - Continue your loratadine - START mucinex  - START Flonase  2. You will be due for FASTING BLOOD WORK.  This means you should eat no food or drink after midnight.  Drink only water or coffee without cream/sugar on the morning of your lab visit. - Please go ahead and schedule a "Lab Only" visit in the morning at the clinic for lab draw in the next 7 days. - Your results will be available about 2-3 days after blood draw.  If you have set up a MyChart account, you can can log in to MyChart online to view your results and a brief explanation. Also, we can discuss your results together at your next office visit if you would like.   3. Continue work toward living a healthy and active lifestyle.  Please schedule a follow-up appointment with Wilhelmina McardleLauren Yasmine Kilbourne, AGNP. Return in about 1 year (around 04/07/2019) for annual physical AND in about 6 weeks for depression.  If you have any other questions or concerns, please feel free to call the clinic or send a message through MyChart. You may also schedule an earlier appointment if necessary.  You will receive a survey after today's visit either digitally by e-mail or paper by [employer] mail. Your experiences and feedback matter to us.  Please respond so we know how we are doing as we provide care for you.   Wilhelmina McardleLauren Krithik Mapel, DNP, AGNP-BC Adult Gerontology Nurse Practitioner [facility], [facility]

## 2018-04-07 ENCOUNTER — Telehealth: Payer: Self-pay | Admitting: Nurse Practitioner

## 2018-04-07 DIAGNOSIS — F5101 Primary insomnia: Secondary | ICD-10-CM

## 2018-04-07 DIAGNOSIS — F419 Anxiety disorder, unspecified: Secondary | ICD-10-CM

## 2018-04-07 MED ORDER — SERTRALINE HCL 100 MG PO TABS: 100.0000 mg | ORAL_TABLET | Freq: Every day | ORAL | 0 refills | Status: DC

## 2018-04-07 MED ORDER — TEMAZEPAM 15 MG PO CAPS: 15.0000 mg | ORAL_CAPSULE | Freq: Every evening | ORAL | 0 refills | Status: DC | PRN

## 2018-04-07 NOTE — Telephone Encounter (Signed)
Pt  Called said that her medication was not called in temazepam to [facility] in [place]

## 2018-04-08 LAB — CYTOLOGY - PAP
Diagnosis: NEGATIVE
HPV: NOT DETECTED

## 2018-04-09 ENCOUNTER — Encounter: Payer: Self-pay | Admitting: Nurse Practitioner

## 2018-04-13 ENCOUNTER — Other Ambulatory Visit: Payer: Federal, State, Local not specified - PPO

## 2018-05-04 ENCOUNTER — Ambulatory Visit (INDEPENDENT_AMBULATORY_CARE_PROVIDER_SITE_OTHER): Payer: Federal, State, Local not specified - PPO | Admitting: Nurse Practitioner

## 2018-05-04 ENCOUNTER — Other Ambulatory Visit: Payer: Self-pay

## 2018-05-04 ENCOUNTER — Encounter: Payer: Self-pay | Admitting: Nurse Practitioner

## 2018-05-04 DIAGNOSIS — F410 Panic disorder [episodic paroxysmal anxiety] without agoraphobia: Secondary | ICD-10-CM | POA: Insufficient documentation

## 2018-05-04 DIAGNOSIS — F419 Anxiety disorder, unspecified: Secondary | ICD-10-CM

## 2018-05-04 DIAGNOSIS — F5101 Primary insomnia: Secondary | ICD-10-CM | POA: Insufficient documentation

## 2018-05-04 MED ORDER — SERTRALINE HCL 100 MG PO TABS: 100.0000 mg | ORAL_TABLET | Freq: Every day | ORAL | 1 refills | Status: DC

## 2018-05-04 MED ORDER — TEMAZEPAM 15 MG PO CAPS: 15.0000 mg | ORAL_CAPSULE | Freq: Every evening | ORAL | 5 refills | Status: DC | PRN

## 2018-05-04 NOTE — Progress Notes (Signed)
Subjective:    Patient ID: Sharon Ponce, female    DOB: 08/24/1970, 47 y.o.   MRN: [id number]  Sharon Ponce is a 47 y.o. female presenting on 05/04/2018 for Anxiety   HPI Anxiety Feels much more relaxed than she was in previous environment at work.  Job change has been significantly better since moving to [place].  Continues to note good adjustment and support.  Medications are currently controlling symptoms well.  Patient has no concerns about her anxiety today.  Insomnia Is taking Restoril about 5 nights per week.  Gets good sleep, better "sound" sleep, less restless sleep, feels rested in am when waking using Restoril.     Depression screen [facility] 2/9 05/04/2018 01/23/2018  Decreased Interest 0 0  Down, Depressed, Hopeless 0 0  PHQ - 2 Score 0 0  Altered sleeping 2 3  Tired, decreased energy 0 0  Change in appetite 0 0  Feeling bad or failure about yourself  0 0  Trouble concentrating 0 0  Moving slowly or fidgety/restless 0 0  Suicidal thoughts 0 0  PHQ-9 Score 2 3  Difficult doing work/chores Not difficult at all Extremely dIfficult   Depression screen [facility] 05/04/2018 01/23/2018  Decreased Interest 0 0  Down, Depressed, Hopeless 0 0  PHQ - 2 Score 0 0  Altered sleeping 2 3  Tired, decreased energy 0 0  Change in appetite 0 0  Feeling bad or failure about yourself  0 0  Trouble concentrating 0 0  Moving slowly or fidgety/restless 0 0  Suicidal thoughts 0 0  PHQ-9 Score 2 3  Difficult doing work/chores Not difficult at all Extremely dIfficult    GAD 7 : Generalized Anxiety Score 05/04/2018 01/23/2018  Nervous, Anxious, on Edge 1 1  Control/stop worrying 0 2  Worry too much - different things 0 2  Trouble relaxing 1 3  Restless 1 3  Easily annoyed or irritable 0 1  Afraid - awful might happen 1 2  Total GAD 7 Score 4 14  Anxiety Difficulty Not difficult at all Extremely difficult      Social History   Tobacco Use  . Smoking status: Current Every Day Smoker   Packs/day: 0.25    Years: 20.00    Pack years: 5.00    Types: Cigarettes  . Smokeless tobacco: Never Used  Substance Use Topics  . Alcohol use: Never    Frequency: Never  . Drug use: Never    Review of Systems Per HPI unless specifically indicated above     Objective:    BP 114/64 (BP Location: Right Arm, Patient Position: Sitting, Cuff Size: Normal)   Pulse (!) 57   Temp 98.3 F (36.8 C) (Oral)   Ht 5\' 4"  (1.626 m)   Wt 136 lb 6.4 oz (61.9 kg)   LMP 04/27/2018   BMI 23.41 kg/m   Wt Readings from Last 3 Encounters:  05/04/18 136 lb 6.4 oz (61.9 kg)  04/06/18 134 lb 9.6 oz (61.1 kg)  01/23/18 130 lb 3.2 oz (59.1 kg)    Physical Exam Vitals signs reviewed.  Constitutional:      General: She is not in acute distress.    Appearance: She is well-developed.  HENT:     Head: Normocephalic and atraumatic.  Cardiovascular:     Rate and Rhythm: Normal rate and regular rhythm.     Heart sounds: Normal heart sounds, S1 normal and S2 normal.  Pulmonary:     Effort: Pulmonary effort is  normal. No respiratory distress.     Breath sounds: Normal breath sounds.  Skin:    General: Skin is warm and dry.     Capillary Refill: Capillary refill takes less than 2 seconds.  Neurological:     General: No focal deficit present.     Mental Status: She is alert and oriented to person, place, and time. Mental status is at baseline.     Gait: Gait normal.  Psychiatric:        Mood and Affect: Mood normal.        Behavior: Behavior normal.        Thought Content: Thought content normal.        Judgment: Judgment normal.       Assessment & Plan:   Problem List Items Addressed This Visit      Other   Primary insomnia    Patient with chronic, stable insomnia.  Patient takes temazepam without any side effects and regularly takes only 5 nights per week.  Patient has tried multiple medications in past without relief including Lunesta, Ambien, Sonata, doxepin, and others.  Temazepam is  only medication that is effective without side effects or withdrawal symptoms. - Continue temazepam 15 mg once daily at bedtime.  Take 5-7 nights per week prn insomnia. - Continue good sleep hygiene. - Follow-up 6 months.      Relevant Medications   sertraline (ZOLOFT) 100 MG tablet   temazepam (RESTORIL) 15 MG capsule   Anxiety    Stable today on exam.  Medications tolerated without side effects.  Continue sertraline 100 mg once daily and temazepam for sleep (see insomnia).  Refills provided.  Encouraged pt to continue non-pharm stress management.. Followup 6 months.       Relevant Medications   sertraline (ZOLOFT) 100 MG tablet     [place]ally [place] 05/04/18.  According to PMP aware, pt last received a refill on 04/07/2018.  Refill of her controlled substance will be provided today.   Meds ordered this encounter  Medications  . sertraline (ZOLOFT) 100 MG tablet    Sig: Take 1 tablet (100 mg total) by mouth daily.    Dispense:  90 tablet    Refill:  1    Order Specific Question:   Supervising Provider    Answer:   Smitty Cords [[id number]]  . temazepam (RESTORIL) 15 MG capsule    Sig: Take 1 capsule (15 mg total) by mouth at bedtime as needed for sleep.    Dispense:  30 capsule    Refill:  5    Run as cash pay for patient please.    Order Specific Question:   Supervising Provider    Answer:   Smitty Cords [[id number]]    Follow up plan: Return in about 6 months (around 11/03/2018) for anxiety, insomnia.  Wilhelmina Mcardle, DNP, AGPCNP-BC Adult Gerontology Primary Care Nurse Practitioner [facility] 05/04/2018, 2:00 PM

## 2018-05-04 NOTE — Patient Instructions (Addendum)
Sharon PeroneBobbi Ponce,   Thank you for coming in to clinic today.  1. Continue sertraline and temazepam without changes.  Continue taking occasional nights off of temazepam so it will continue to be effective for you.  Please schedule a follow-up appointment with Wilhelmina McardleLauren Neelie Welshans, AGNP. Return in about 6 months (around 11/03/2018) for anxiety, insomnia.  If you have any other questions or concerns, please feel free to call the clinic or send a message through MyChart. You may also schedule an earlier appointment if necessary.  You will receive a survey after today's visit either digitally by e-mail or paper by [employer] mail. Your experiences and feedback matter to us.  Please respond so we know how we are doing as we provide care for you.   Wilhelmina McardleLauren Elman Dettman, DNP, AGNP-BC Adult Gerontology Nurse Practitioner [facility], [facility]

## 2018-05-04 NOTE — Assessment & Plan Note (Signed)
Stable today on exam.  Medications tolerated without side effects.  Continue sertraline 100 mg once daily and temazepam for sleep (see insomnia).  Refills provided.  Encouraged pt to continue non-pharm stress management.. Followup 6 months.

## 2018-05-04 NOTE — Assessment & Plan Note (Signed)
Patient with chronic, stable insomnia.  Patient takes temazepam without any side effects and regularly takes only 5 nights per week.  Patient has tried multiple medications in past without relief including Lunesta, Ambien, Sonata, doxepin, and others.  Temazepam is only medication that is effective without side effects or withdrawal symptoms. - Continue temazepam 15 mg once daily at bedtime.  Take 5-7 nights per week prn insomnia. - Continue good sleep hygiene. - Follow-up 6 months.

## 2018-10-06 ENCOUNTER — Other Ambulatory Visit: Payer: Self-pay | Admitting: Nurse Practitioner

## 2018-10-06 DIAGNOSIS — F5101 Primary insomnia: Secondary | ICD-10-CM

## 2018-10-13 ENCOUNTER — Encounter: Payer: Self-pay | Admitting: Family Medicine

## 2018-10-13 ENCOUNTER — Other Ambulatory Visit: Payer: Self-pay

## 2018-10-13 ENCOUNTER — Ambulatory Visit (INDEPENDENT_AMBULATORY_CARE_PROVIDER_SITE_OTHER): Payer: Federal, State, Local not specified - PPO | Admitting: Family Medicine

## 2018-10-13 VITALS — BP 101/65 | HR 60 | Temp 98.2°F | Ht 64.0 in | Wt 136.0 lb

## 2018-10-13 DIAGNOSIS — G43719 Chronic migraine without aura, intractable, without status migrainosus: Secondary | ICD-10-CM | POA: Diagnosis not present

## 2018-10-13 DIAGNOSIS — F419 Anxiety disorder, unspecified: Secondary | ICD-10-CM | POA: Diagnosis not present

## 2018-10-13 DIAGNOSIS — N951 Menopausal and female climacteric states: Secondary | ICD-10-CM | POA: Diagnosis not present

## 2018-10-13 DIAGNOSIS — F5101 Primary insomnia: Secondary | ICD-10-CM

## 2018-10-13 MED ORDER — SUMATRIPTAN SUCCINATE 50 MG PO TABS: 50.0000 mg | ORAL_TABLET | Freq: Once | ORAL | 2 refills | Status: DC | PRN

## 2018-10-13 MED ORDER — TEMAZEPAM 15 MG PO CAPS: 15.0000 mg | ORAL_CAPSULE | Freq: Every evening | ORAL | 2 refills | Status: DC | PRN

## 2018-10-13 MED ORDER — VENLAFAXINE HCL 37.5 MG PO TABS: 37.5000 mg | ORAL_TABLET | Freq: Two times a day (BID) | ORAL | 2 refills | Status: DC

## 2018-10-13 NOTE — Patient Instructions (Addendum)
Thank you for coming to the office today.  TAPER DOWN Sertraline (Zoloft) 100mg  - take HALF pill for 50mg  daily for 1 week then SWITCH TO Venlafaxine (Effexor 37.5mg  TWICE A DAY - may need to increase dose up to 75 twice a day or switch to 24 hour pill in future, after 2-4 weeks  It can help reduce hot flash, anxiety, and migraine prevention   1. You most likely have Chronic Migraine Headaches - Migraine headaches present differently for many patients, pain is usually throbbing or aching, often on one side of head or behind the eye. They tend to last for up to hours or days. In treating migraines, our goal is to 1) stop the headache and 2) prevent recurrence of headaches  Treatment to STOP the headache at this time: - Start with Sumatriptan 50mg  - take 1 immediately at onset of moderate to severe migraine headache, if unresolved or return within 2 hours then repeat dose 1 tablet, that is max dose for 24 hours. (Note - this medication can cause a brief episode of flushing and chest pressure or pain very soon after taking it. That is NORMAL, and it is the medicine taking effect and dilating some blood vessels. It should pass, and resolve within seconds to minutes after - it may not happen at all)  - Try this for 1-2 weeks, if absolutely NOT helping then contact me to discuss changes, possibly can double sumatriptan dose or try nasal spray - You can still take over the counter meds with Ibuprofen up to 600-800mg  per dose 3 times a day with food for a few days and may try Excedrin Migraine as needed, ONLY use these after you have tried Sumatriptan up to 2 doses, and try to limit their use if they are not effective   Treatment to PREVENT headaches: - Goal is to avoid triggers. We need to learn more details on what are your exact or possible headache triggers first. - Keep detailed headache diary (on printed handout) for possible triggers, bring this to your next visit to discuss further - Known  possible triggers include caffeine, chocolate, alcohol, stress, weather changes, menstrual cycle, certain other foods - Also be aware that OTC pain meds/anti-inflammatories can cause rebound headache, they help resolve the headache but then after the effect wears off they can CAUSE a headache. Try to taper down and stop these medications and allow them to get out of your system for 1-2 weeks   Look into some of these alternatives for Migraine Headache Prophylaxis or preventative treatment  Antidepressant Type - Venlafaxine, Amitriptyline Blood pressure meds - Metoprolol, Propanolol, Verapamil Anti Headache/Seizure/Nerve medications - Topamax, Gabapentin  Future medication options that can be considered if persistent severe migraines and if failed some of the above prevention medicines include: - Aimovig, Emgality, Trulicity   (these are once a month self injection medications that work directly to block migraine signals. They are relatively new, can be costly and have been proven to be very safe and effective. We can try to get them approved or for low cost if needed in future.)   Please schedule a Follow-up Appointment to: Return in about 6 weeks (around 11/24/2018) for migraine, anxiety, menopausal hot flash.  If you have any other questions or concerns, please feel free to call the office or send a message through MyChart. You may also schedule an earlier appointment if necessary.  Additionally, you may be receiving a survey about your experience at our office within a few  days to 1 week by e-mail or mail. We value your feedback.  Alexander Karamalegos, DO [facility], [facility] 

## 2018-10-13 NOTE — Progress Notes (Signed)
Subjective:    Patient ID: Sharon Ponce, female    DOB: [birth year], 48 y.o.   MRN: [id number]  Sharon Ponce is a 48 y.o. female presenting on 10/13/2018 for manoposal (HA, irregular menstrual cycle)  Virtual / Telehealth Encounter - Video Visit via [employer] The purpose of this virtual visit is to provide medical care while limiting exposure to the novel coronavirus (COVID19) for both patient and office staff.  Consent was obtained for remote visit:  Yes.   Answered questions that patient had about telehealth interaction:  Yes.   I discussed the limitations, risks, security and privacy concerns of performing an evaluation and management service by video/telephone. I also discussed with the patient that there may be a patient responsible charge related to this service. The patient expressed understanding and agreed to proceed.  Patient Location: Home Provider Location: [facility] (Office)  PCP is Wilhelmina Mcardle, AGPCNP-BC - I am currently covering during her maternity leave.   HPI   CHRONIC HEADACHE / Migraines and Tension Chronic problem >1-2 years now, had been self managing, thinks it is worse now with anxiety and menopausal symptoms. Worse with work changes now working at home, increased screen time on computer for work. Location: Frontal usually L or R variable. ALSO has headaches back of neck tension in muscles shoulders up into back of head Quality: Throbbing pulsing - frontal and behind eye. If location back of neck - usually dull ache Duration of headache without treatment: >24 hours Current Frequency: 5 x week Longest duration without headache: 2-3 days in past several months Accompanying symptoms: nausea, photophobia, phonophobia,  Effective treatment: Ibuprofen  1-2 times a day PRN, Lights off dark room and rest - usually sleep and improvement next day - Also Botox treatments for skin cosmetic of face - has shown her some relief of migraines in this area  Ineffective treatment: none History of headaches: chronic history but has never been treated for migraines before. History of imaging: no CT or MRI. - Denies vomiting, numbness tingling weakness, loss of vision  Perimenopausal / Vasomotor Symptoms Reports issue for few years now some perimenopausal symptoms, and hot flashes, in past a GYN rx gabapentin for her and it was helpful for hot flashes, but then she was told it was a controlled substance and she discontinued it. She now is not taking anything for hot flashes. She has irregular menstrual cycle - her mother has history of menopause at age 9 without other associated symptoms   Anxiety / Insomnia - Chronic issue related to anxiety, followed by PCP. On Temazepam with good results, taking regularly, due for refill. - Also taking Sertraline  daily SSRI   Depression screen [facility] 2/9 10/13/2018 05/04/2018 01/23/2018  Decreased Interest 0 0 0  Down, Depressed, Hopeless 0 0 0  PHQ - 2 Score 0 0 0  Altered sleeping - 2 3  Tired, decreased energy - 0 0  Change in appetite - 0 0  Feeling bad or failure about yourself  - 0 0  Trouble concentrating - 0 0  Moving slowly or fidgety/restless - 0 0  Suicidal thoughts - 0 0  PHQ-9 Score - 2 3  Difficult doing work/chores - Not difficult at all Extremely dIfficult   GAD 7 : Generalized Anxiety Score 05/04/2018 01/23/2018  Nervous, Anxious, on Edge 1 1  Control/stop worrying 0 2  Worry too much - different things 0 2  Trouble relaxing 1 3  Restless 1 3  Easily annoyed or  irritable 0 1  Afraid - awful might happen 1 2  Total GAD 7 Score 4 14  Anxiety Difficulty Not difficult at all Extremely difficult     Social History   Tobacco Use  . Smoking status: Current Every Day Smoker    Packs/day: 0.25    Years: 20.00    Pack years: 5.00    Types: Cigarettes  . Smokeless tobacco: Current User  Substance Use Topics  . Alcohol use: Never    Frequency: Never  . Drug use: Never     Review of Systems Per HPI unless specifically indicated above     Objective:    BP 101/65   Pulse 60   Temp 98.2 F (36.8 C) (Oral)   Ht 5\' 4"  (1.626 m)   Wt 136 lb (61.7 kg)   LMP 04/27/2018   BMI 23.34 kg/m   Wt Readings from Last 3 Encounters:  10/13/18 136 lb (61.7 kg)  05/04/18 136 lb 6.4 oz (61.9 kg)  04/06/18 134 lb 9.6 oz (61.1 kg)    Physical Exam   Note examination was completely remotely via video observation objective data only  Gen - well-appearing, no acute distress or apparent pain, comfortable HEENT - eyes appear clear without discharge or redness Heart/Lungs - cannot examine virtually - observed no evidence of coughing or labored breathing. Abd - cannot examine virtually  Skin - face visible today- no rash Neuro - awake, alert, oriented Psych - mildly anxious appearing, but good insight into mental health and mood   Results for orders placed or performed in visit on 04/06/18  Cytology - PAP  Result Value Ref Range   Adequacy      Satisfactory for evaluation  endocervical/transformation zone component PRESENT.   Diagnosis      NEGATIVE FOR INTRAEPITHELIAL LESIONS OR MALIGNANCY.   HPV NOT DETECTED    Material Submitted CervicoVaginal Pap [ThinPrep Imaged]    CYTOLOGY - PAP PAP RESULT       Assessment & Plan:   Problem List Items Addressed This Visit    Anxiety  Chronic anxiety, on SSRI sertraline 100mg  - See A&P Below - decided to change therapy to SNRI for migraine prophylaxis and vasomotor perimenopausal symptoms     Relevant Medications   venlafaxine (EFFEXOR) 37.5 MG tablet   Primary insomnia  Chronic problem related to stress and anxiety, improved on Temazepam Checked [place] CSRS Agree to continue current therapy on from her PCP with Temazepam - refilled    Relevant Medications   temazepam (RESTORIL) 15 MG capsule   venlafaxine (EFFEXOR) 37.5 MG tablet    Other Visit Diagnoses    Intractable chronic migraine without aura and without  status migrainosus    -  Primary   Relevant Medications   venlafaxine (EFFEXOR) 37.5 MG tablet   SUMAtriptan (IMITREX) 50 MG tablet   Perimenopausal vasomotor symptoms     Trial on SNRI Venlafaxine - Discussed gabapentin was ineffective in past can reconsider adding this if need as well      Consistent with increasing frequency migraine HA x w/ intermittent worsening now more frequent over past several months Currently without active HA, well-appearing, no focal neuro deficits, tolerating PO w/o n/v - Inadequately treated for migraine HA at home - only high dose motrin, no prior treatments for abortive or prophylaxis  Plan: 1. Start abortive therapy with Sumatriptan 50mg  tabs - take 1 PRN (#12, 0 refill due to quantity limit), severe HA, may repeat dose within 2 hr if  persistent, no more in 24 hours, in future can titrate dose to 50-100mg  if needed. Counseling on potential side effect / intolerance with chest discomfort acutely after taking sumatriptan 2. Recommend taking Ibuprofen 600-800mg  q 8 hr PRN, and can try Tylenol 1000mg  TID alternatively 3. Avoid triggers including foods, caffeine. Important to rest. - Discussion on future migraine prophylaxis medications - SWITCH off Sertraline 100mg  daily taper down by half for 50mg  daily for 1 week then switch to Venlafaxine 37.5mg  BID for SNRI goal of migraine prophylaxis - can titrate up dose to 75mg  BID or XL in future 4. Start headache diary, handout given, identify triggers for avoidance, bring to next visit 5. Return criteria given for acute migraine, when to go to office vs ED  Future consider CGRP inhibitors   Meds ordered this encounter  Medications  . temazepam (RESTORIL) 15 MG capsule    Sig: Take 1 capsule (15 mg total) by mouth at bedtime as needed for sleep.    Dispense:  30 capsule    Refill:  2    Run as cash pay for patient please.  . venlafaxine (EFFEXOR) 37.5 MG tablet    Sig: Take 1 tablet (37.5 mg total) by mouth 2  (two) times daily.    Dispense:  60 tablet    Refill:  2  . SUMAtriptan (IMITREX) 50 MG tablet    Sig: Take 1 tablet (50 mg total) by mouth once as needed for up to 1 dose for migraine. May repeat one dose in 2 hours if headache persists, for max dose 24 hours    Dispense:  12 tablet    Refill:  2     Follow-up: - Return in 6 weeks to 3 months w/ PCP for migraine, anxiety, menopausal hot flash  Patient verbalizes understanding with the above medical recommendations including the limitation of remote medical advice.  Specific follow-up and call-back criteria were given for patient to follow-up or seek medical care more urgently if needed.  Total duration of direct patient care provided via video conference: 29 minutes   Saralyn PilarAlexander Karamalegos, DO [facility] 10/13/2018, 1:27 PM

## 2018-11-02 ENCOUNTER — Ambulatory Visit: Payer: Federal, State, Local not specified - PPO | Admitting: Nurse Practitioner

## 2018-11-04 ENCOUNTER — Other Ambulatory Visit: Payer: Self-pay | Admitting: Nurse Practitioner

## 2018-11-04 DIAGNOSIS — F5101 Primary insomnia: Secondary | ICD-10-CM

## 2018-12-14 ENCOUNTER — Ambulatory Visit (INDEPENDENT_AMBULATORY_CARE_PROVIDER_SITE_OTHER): Payer: Federal, State, Local not specified - PPO | Admitting: Nurse Practitioner

## 2018-12-14 ENCOUNTER — Other Ambulatory Visit: Payer: Self-pay

## 2018-12-14 ENCOUNTER — Encounter: Payer: Self-pay | Admitting: Nurse Practitioner

## 2018-12-14 DIAGNOSIS — F5101 Primary insomnia: Secondary | ICD-10-CM

## 2018-12-14 DIAGNOSIS — G43719 Chronic migraine without aura, intractable, without status migrainosus: Secondary | ICD-10-CM

## 2018-12-14 DIAGNOSIS — F419 Anxiety disorder, unspecified: Secondary | ICD-10-CM | POA: Diagnosis not present

## 2018-12-14 MED ORDER — TEMAZEPAM 15 MG PO CAPS: 15.0000 mg | ORAL_CAPSULE | Freq: Every day | ORAL | 2 refills | Status: DC

## 2018-12-14 MED ORDER — VENLAFAXINE HCL ER 150 MG PO CP24: 150.0000 mg | ORAL_CAPSULE | Freq: Every day | ORAL | 5 refills | Status: DC

## 2018-12-14 NOTE — Patient Instructions (Addendum)
Sharon Ponce,   Thank you for coming in to clinic today.  1. Increase Effexor to 150 mg once daily.  2. You may increase temazepam to 30 mg on 3 nights per week.  You have #45 tabs for your next several fills.  We will reduce your dose again in future visits.  Please schedule a follow-up appointment with Cassell Smiles, AGNP. Return in about 3 months (around 03/16/2019) for anxiety and insomnia.  If you have any other questions or concerns, please feel free to call the clinic or send a message through [place]. You may also schedule an earlier appointment if necessary.  You will receive a survey after today's visit either digitally by e-mail or paper by [employer]. Your experiences and feedback matter to [place].  Please respond so we know how we are doing as we provide care for you.  Cassell Smiles, DNP, AGNP-BC Adult Gerontology Nurse Practitioner [place]

## 2018-12-14 NOTE — Progress Notes (Signed)
Subjective:    Patient ID: Sharon Ponce, female    DOB: 07/16/1970, 48 y.o.   MRN: [id number]  Sharon Ponce is a 48 y.o. female presenting on 12/14/2018 for Anxiety and Insomnia  HPI Anxiety and insomnia Patient is currently taking venlafaxine 37.5 mg bid, temazepam 15 mg at bedtime. Patient is not currently bothered by current level of anxiety, but notes that it is increasingly difficult to get to sleep at night.  Her sleep patterns are currently bothering her workdays most.  Restoril still helps fair amount for sleep, but she still has restless sleep many nights. Notes she is learning to survive daily with anxiety, but has not done quite as well recently.  She is working from home and not interacting with many people.  She also notes a coworker died suddenly about 1 month ago and has had additional increase in stress subconsciously related to this. Overall, feels well adjusted to current Covid and stressful political climates.  GAD 7 : Generalized Anxiety Score 12/14/2018 05/04/2018 01/23/2018  Nervous, Anxious, on Edge 2 1 1   Control/stop worrying 1 0 2  Worry too much - different things 1 0 2  Trouble relaxing 2 1 3   Restless 3 1 3   Easily annoyed or irritable 2 0 1  Afraid - awful might happen 1 1 2   Total GAD 7 Score 12 4 14   Anxiety Difficulty Not difficult at all Not difficult at all Extremely difficult   [facility] 12/14/2018 10/13/2018 05/04/2018 01/23/2018  Decreased Interest 0 0 0 0  Down, Depressed, Hopeless 0 0 0 0  PHQ - 2 Score 0 0 0 0  Altered sleeping 3 - 2 3  Tired, decreased energy 1 - 0 0  Change in appetite 0 - 0 0  Feeling bad or failure about yourself  0 - 0 0  Trouble concentrating 0 - 0 0  Moving slowly or fidgety/restless 0 - 0 0  Suicidal thoughts 0 - 0 0  PHQ-9 Score 4 - 2 3  Difficult doing work/chores Not difficult at all - Not difficult at all Extremely dIfficult   Social History   Tobacco Use  . Smoking status: Current Every Day Smoker   Packs/day: 0.25    Years: 20.00    Pack years: 5.00    Types: Cigarettes  . Smokeless tobacco: Current User  Substance Use Topics  . Alcohol use: Never    Frequency: Never  . Drug use: Never    Review of Systems Per HPI unless specifically indicated above     Objective:    Ht 5\' 4"  (1.626 m)   Wt 145 lb (65.8 kg)   LMP 11/07/2018   Breastfeeding Unknown   BMI 24.89 kg/m   Wt Readings from Last 3 Encounters:  12/14/18 145 lb (65.8 kg)  10/13/18 136 lb (61.7 kg)  05/04/18 136 lb 6.4 oz (61.9 kg)    Physical Exam Vitals signs reviewed.  Constitutional:      General: She is not in acute distress.    Appearance: She is well-developed.  HENT:     Head: Normocephalic and atraumatic.  Cardiovascular:     Rate and Rhythm: Normal rate and regular rhythm.     Heart sounds: Normal heart sounds.  Pulmonary:     Effort: Pulmonary effort is normal.     Breath sounds: Normal breath sounds.  Skin:    General: Skin is warm and dry.  Neurological:     Mental Status: She is  alert and oriented to person, place, and time.  Psychiatric:        Attention and Perception: Attention and perception normal.        Mood and Affect: Mood is anxious.        Speech: Speech is rapid and pressured.        Behavior: Behavior normal.        Thought Content: Thought content normal.        Cognition and Memory: Cognition and memory normal.        Judgment: Judgment normal.    Results for orders placed or performed in visit on 04/06/18  Cytology - PAP  Result Value Ref Range   Adequacy      Satisfactory for evaluation  endocervical/transformation zone component PRESENT.   Diagnosis      NEGATIVE FOR INTRAEPITHELIAL LESIONS OR MALIGNANCY.   HPV NOT DETECTED    Material Submitted CervicoVaginal Pap [ThinPrep Imaged]    CYTOLOGY - PAP PAP RESULT       Assessment & Plan:   Problem List Items Addressed This Visit      Other   Primary insomnia   Relevant Medications   temazepam (RESTORIL)  15 MG capsule (Start on 12/31/2018)   Anxiety   Relevant Medications   venlafaxine XR (EFFEXOR-XR) 150 MG 24 hr capsule    Other Visit Diagnoses    Intractable chronic migraine without aura and without status migrainosus       Relevant Medications   venlafaxine XR (EFFEXOR-XR) 150 MG 24 hr capsule      Anxiety and insomnia Generally stable.  Insomnia is not responding as well to temazepam, likely due to tolerance.  Otherwise, patient is noting reasonably good anxiety symptoms despite Covid isolation.  Plan: 1. Patient may temporarily increase temazepam to 30 mg at bedtime on up to 3 days per week. Future fills will return to 30 capsules for 30 days.  Patient verbalizes understanding. 2. Continue venlafaxine for anxiety and migraine control.  INCREASE dose to 150 mg daily.  Change from immediate release to extended release. 3. Follow-up 4-6 weeks if needed AND in 3 months.  Meds ordered this encounter  Medications  . temazepam (RESTORIL) 15 MG capsule    Sig: Take 1-2 capsules (15-30 mg total) by mouth at bedtime. Take 2 capsules prior to work days as needed.    Dispense:  45 capsule    Refill:  2    Order Specific Question:   Supervising Provider    Answer:   Olin Hauser [[id number]]  . venlafaxine XR (EFFEXOR-XR) 150 MG 24 hr capsule    Sig: Take 1 capsule (150 mg total) by mouth daily with breakfast.    Dispense:  30 capsule    Refill:  5    Order Specific Question:   Supervising Provider    Answer:   Olin Hauser [[id number]]   Follow up plan: Return in about 3 months (around 03/16/2019) for anxiety and insomnia.  Cassell Smiles, DNP, AGPCNP-BC Adult Gerontology Primary Care Nurse Practitioner [facility] 12/14/2018, 2:33 PM

## 2018-12-17 ENCOUNTER — Encounter: Payer: Self-pay | Admitting: Nurse Practitioner

## 2019-03-01 DIAGNOSIS — J301 Allergic rhinitis due to pollen: Secondary | ICD-10-CM | POA: Diagnosis not present

## 2019-03-08 ENCOUNTER — Ambulatory Visit (INDEPENDENT_AMBULATORY_CARE_PROVIDER_SITE_OTHER): Payer: Federal, State, Local not specified - PPO | Admitting: Nurse Practitioner

## 2019-03-08 ENCOUNTER — Encounter: Payer: Self-pay | Admitting: Nurse Practitioner

## 2019-03-08 ENCOUNTER — Other Ambulatory Visit: Payer: Self-pay

## 2019-03-08 VITALS — BP 94/62 | HR 61 | Temp 98.6°F | Ht 64.0 in | Wt 153.4 lb

## 2019-03-08 DIAGNOSIS — F5101 Primary insomnia: Secondary | ICD-10-CM | POA: Diagnosis not present

## 2019-03-08 DIAGNOSIS — G43719 Chronic migraine without aura, intractable, without status migrainosus: Secondary | ICD-10-CM

## 2019-03-08 DIAGNOSIS — J3089 Other allergic rhinitis: Secondary | ICD-10-CM

## 2019-03-08 DIAGNOSIS — F419 Anxiety disorder, unspecified: Secondary | ICD-10-CM

## 2019-03-08 MED ORDER — SUMATRIPTAN SUCCINATE 50 MG PO TABS: 50.0000 mg | ORAL_TABLET | Freq: Once | ORAL | 2 refills | Status: DC | PRN

## 2019-03-08 MED ORDER — TEMAZEPAM 15 MG PO CAPS: 15.0000 mg | ORAL_CAPSULE | Freq: Every day | ORAL | 5 refills | Status: DC

## 2019-03-08 MED ORDER — VENLAFAXINE HCL ER 150 MG PO CP24: 150.0000 mg | ORAL_CAPSULE | Freq: Every day | ORAL | 1 refills | Status: DC

## 2019-03-08 MED ORDER — MONTELUKAST SODIUM 10 MG PO TABS: 10.0000 mg | ORAL_TABLET | Freq: Every day | ORAL | 5 refills | Status: DC

## 2019-03-08 NOTE — Patient Instructions (Addendum)
Sharon Ponce,   Thank you for coming in to clinic today.  1. Venlafaxine is working well, continue on lower dose temazepam.  2. Continue Flonase - ADD montelukast - Drug class: mast cell stabilizer.  These help reduce mucous production.  3. Consider sleep study to identify exact sleep cycle disorder.   - Let us know if you decide to proceed with this.  It could help [place] change medications.  Please schedule a follow-up appointment. Return in about 6 months (around 09/06/2019) for anxiety, insomnia.  If you have any other questions or concerns, please feel free to call the clinic or send a message through [place]. You may also schedule an earlier appointment if necessary.  You will receive a survey after today's visit either digitally by e-mail or paper by [employer]. Your experiences and feedback matter to [place].  Please respond so we know how we are doing as we provide care for you.  Cassell Smiles, DNP, AGNP-BC Adult Gerontology Nurse Practitioner [place]

## 2019-03-08 NOTE — Progress Notes (Signed)
Subjective:    Patient ID: Sharon Ponce, female    DOB: January 09, 1971, 48 y.o.   MRN: [id number]  Sharon Ponce is a 48 y.o. female presenting on 03/08/2019 for Anxiety  HPI Anxiety/Insomnia Temazepam continues to provide some improvement of insomnia.  Patient increased to 30 mg and had no increase in sleep quantity or quality.  Resumed lower dose of 15 mg daily.   Patient continues to note general anxiousness.  Feels largely improved since last visit.  No current specific complaints about moods today.  Envirionmental Allergies Patient awaiting testing with ENT/Allergy.  Flonase has improved symptoms to date.  Not currently on antihistamine or mast cell stabilizer.  Specific allergen unkonwn at this time.  Plans for testing and possible allergy shots in future.  GAD 7 : Generalized Anxiety Score 03/08/2019 12/14/2018 05/04/2018 01/23/2018  Nervous, Anxious, on Edge 1 2 1 1   Control/stop worrying 0 1 0 2  Worry too much - different things 1 1 0 2  Trouble relaxing 1 2 1 3   Restless 0 3 1 3   Easily annoyed or irritable 1 2 0 1  Afraid - awful might happen 0 1 1 2   Total GAD 7 Score 4 12 4 14   Anxiety Difficulty Not difficult at all Not difficult at all Not difficult at all Extremely difficult   Depression screen [facility] 2/9 03/08/2019 12/14/2018 10/13/2018 05/04/2018 01/23/2018  Decreased Interest 0 0 0 0 0  Down, Depressed, Hopeless 0 0 0 0 0  PHQ - 2 Score 0 0 0 0 0  Altered sleeping 2 3 - 2 3  Tired, decreased energy 0 1 - 0 0  Change in appetite 0 0 - 0 0  Feeling bad or failure about yourself  0 0 - 0 0  Trouble concentrating 0 0 - 0 0  Moving slowly or fidgety/restless 0 0 - 0 0  Suicidal thoughts 0 0 - 0 0  PHQ-9 Score 2 4 - 2 3  Difficult doing work/chores Not difficult at all Not difficult at all - Not difficult at all Extremely dIfficult     Social History   Tobacco Use  . Smoking status: Current Every Day Smoker    Packs/day: 0.25    Years: 20.00    Pack years: 5.00    Types:  Cigarettes  . Smokeless tobacco: Never Used  Substance Use Topics  . Alcohol use: Never    Frequency: Never  . Drug use: Never    Review of Systems Per HPI unless specifically indicated above     Objective:    BP 94/62 (BP Location: Right Arm, Patient Position: Sitting, Cuff Size: Normal)   Pulse 61   Temp 98.6 F (37 C) (Oral)   Ht 5\' 4"  (1.626 m)   Wt 153 lb 6.4 oz (69.6 kg)   BMI 26.33 kg/m   Wt Readings from Last 3 Encounters:  03/08/19 153 lb 6.4 oz (69.6 kg)  12/14/18 145 lb (65.8 kg)  10/13/18 136 lb (61.7 kg)    Physical Exam Vitals signs reviewed.  Constitutional:      General: She is not in acute distress.    Appearance: She is well-developed and normal weight.  HENT:     Head: Normocephalic and atraumatic.  Cardiovascular:     Rate and Rhythm: Normal rate and regular rhythm.     Pulses: Normal pulses.     Heart sounds: Normal heart sounds.  Skin:    General: Skin is warm and dry.  Capillary Refill: Capillary refill takes less than 2 seconds.  Neurological:     General: No focal deficit present.     Mental Status: She is alert and oriented to person, place, and time. Mental status is at baseline.  Psychiatric:        Attention and Perception: Attention and perception normal.        Mood and Affect: Mood is anxious.        Speech: Speech is rapid and pressured.        Behavior: Behavior normal.        Thought Content: Thought content normal.        Judgment: Judgment normal.    Results for orders placed or performed in visit on 04/06/18  Cytology - PAP  Result Value Ref Range   Adequacy      Satisfactory for evaluation  endocervical/transformation zone component PRESENT.   Diagnosis      NEGATIVE FOR INTRAEPITHELIAL LESIONS OR MALIGNANCY.   HPV NOT DETECTED    Material Submitted CervicoVaginal Pap [ThinPrep Imaged]    CYTOLOGY - PAP PAP RESULT       Assessment & Plan:   Problem List Items Addressed This Visit      Other   Primary  insomnia Stable today on exam.  Medications tolerated without side effects.  Continue at current doses.  Refills provided.  Consider future sleep study. Followup 3 months.    Relevant Medications   temazepam (RESTORIL) 15 MG capsule (Start on 04/05/2019)   Anxiety Stable today on exam.  Medications tolerated without side effects.  Continue at current doses.  Refills provided.   . Followup 3 months.    Relevant Medications   venlafaxine XR (EFFEXOR-XR) 150 MG 24 hr capsule    Other Visit Diagnoses    Non-seasonal allergic rhinitis, unspecified trigger    -  Primary Continue plan with allergist for testing.  START montelukast for improved symptoms.  Will need to stop prior to allergy testing.  Follow-up prn.   Relevant Medications   montelukast (SINGULAIR) 10 MG tablet   Intractable chronic migraine without aura and without status migrainosus     Stable without changes.  Medications refilled.  Follow-up 3 months.   Relevant Medications   venlafaxine XR (EFFEXOR-XR) 150 MG 24 hr capsule   SUMAtriptan (IMITREX) 50 MG tablet      Meds ordered this encounter  Medications  . montelukast (SINGULAIR) 10 MG tablet    Sig: Take 1 tablet (10 mg total) by mouth at bedtime.    Dispense:  30 tablet    Refill:  5    Order Specific Question:   Supervising Provider    Answer:   Smitty Cords [[id number]]  . venlafaxine XR (EFFEXOR-XR) 150 MG 24 hr capsule    Sig: Take 1 capsule (150 mg total) by mouth daily with breakfast.    Dispense:  90 capsule    Refill:  1    Order Specific Question:   Supervising Provider    Answer:   Smitty Cords [[id number]]  . temazepam (RESTORIL) 15 MG capsule    Sig: Take 1 capsule (15 mg total) by mouth at bedtime. Take 2 capsules prior to work days as needed.    Dispense:  30 capsule    Refill:  5    Cancel prior refills on last prescription.    Order Specific Question:   Supervising Provider    Answer:   Smitty Cords [[id number]]  . SUMAtriptan  (  IMITREX) 50 MG tablet    Sig: Take 1 tablet (50 mg total) by mouth once as needed for up to 1 dose for migraine. May repeat one dose in 2 hours if headache persists, for max dose 24 hours    Dispense:  12 tablet    Refill:  2    Order Specific Question:   Supervising Provider    Answer:   Smitty CordsKARAMALEGOS, ALEXANDER J [[id number]]    Follow up plan: Return in about 6 months (around 09/06/2019) for anxiety, insomnia.  Wilhelmina McardleLauren Nastassia Bazaldua, DNP, AGPCNP-BC Adult Gerontology Primary Care Nurse Practitioner [facility] 03/08/2019, 10:54 AM

## 2019-03-16 ENCOUNTER — Ambulatory Visit: Payer: Federal, State, Local not specified - PPO | Admitting: Nurse Practitioner

## 2019-04-12 ENCOUNTER — Encounter: Payer: Federal, State, Local not specified - PPO | Admitting: Nurse Practitioner

## 2019-05-27 ENCOUNTER — Telehealth: Payer: Self-pay | Admitting: Nurse Practitioner

## 2019-05-27 DIAGNOSIS — F5101 Primary insomnia: Secondary | ICD-10-CM

## 2019-05-27 MED ORDER — TEMAZEPAM 15 MG PO CAPS: 15.0000 mg | ORAL_CAPSULE | Freq: Every evening | ORAL | 1 refills | Status: DC | PRN

## 2019-05-27 NOTE — Telephone Encounter (Signed)
[facility] called request a new prescription for temazepam

## 2019-05-27 NOTE — Telephone Encounter (Signed)
Sent new rx Temazepam 15mg  nightly 30 pills +1 refill.  Previous PCP , AGPCNP-BC ordered 30 with 5 refills back in 02/2019.  This request is due to needing to have my DEA information on updated rx, since previous provider is no longer here.  03/2019, DO [facility] 05/27/2019, 12:57 PM

## 2019-07-09 ENCOUNTER — Other Ambulatory Visit: Payer: Self-pay

## 2019-07-09 ENCOUNTER — Ambulatory Visit (INDEPENDENT_AMBULATORY_CARE_PROVIDER_SITE_OTHER): Payer: Federal, State, Local not specified - PPO | Admitting: Family Medicine

## 2019-07-09 ENCOUNTER — Encounter: Payer: Self-pay | Admitting: Family Medicine

## 2019-07-09 VITALS — BP 107/72 | HR 75 | Temp 98.4°F | Ht 64.0 in | Wt 157.0 lb

## 2019-07-09 DIAGNOSIS — R319 Hematuria, unspecified: Secondary | ICD-10-CM

## 2019-07-09 DIAGNOSIS — Z1239 Encounter for other screening for malignant neoplasm of breast: Secondary | ICD-10-CM | POA: Diagnosis not present

## 2019-07-09 DIAGNOSIS — Z0000 Encounter for general adult medical examination without abnormal findings: Secondary | ICD-10-CM | POA: Diagnosis not present

## 2019-07-09 DIAGNOSIS — J3089 Other allergic rhinitis: Secondary | ICD-10-CM

## 2019-07-09 DIAGNOSIS — L821 Other seborrheic keratosis: Secondary | ICD-10-CM

## 2019-07-09 DIAGNOSIS — F5101 Primary insomnia: Secondary | ICD-10-CM

## 2019-07-09 LAB — POCT URINALYSIS DIPSTICK
Bilirubin, UA: NEGATIVE
Glucose, UA: NEGATIVE
Ketones, UA: NEGATIVE
Leukocytes, UA: NEGATIVE
Nitrite, UA: POSITIVE
Protein, UA: NEGATIVE
Spec Grav, UA: <=1.005 — AB (ref 1.010–1.025)
Urobilinogen, UA: 0.2 E.U./dL
pH, UA: 5 (ref 5.0–8.0)

## 2019-07-09 MED ORDER — FLUTICASONE PROPIONATE 50 MCG/ACT NA SUSP: NASAL | 3 refills | Status: DC

## 2019-07-09 MED ORDER — TEMAZEPAM 15 MG PO CAPS: 15.0000 mg | ORAL_CAPSULE | Freq: Every evening | ORAL | 1 refills | Status: DC | PRN

## 2019-07-09 NOTE — Patient Instructions (Addendum)
Mood  The following recommendations are helpful adjuncts for helping rebalance your mood.  Eat a nourishing diet. Ensure adequate intake of calories, protein, carbs, fat, vitamins, and minerals. Prioritize whole foods at each meal, including meats, vegetables, fruits, nuts and seeds, etc.   Avoid inflammatory and/or "junk" foods, such as sugar, omega-6 fats, refined grains, chemicals, and preservatives are common in packaged and prepared foods. Minimize or completely avoid these ingredients and stick to whole foods with little to no additives. Cook from scratch as much as possible for more control over what you eat  Get enough sleep. Poor sleep is significantly associated with depression and anxiety. Make 7-9 hours of sleep nightly a top priority  Exercise appropriately. Exercise is known to improve brain functioning and boost mood. Aim for 30 minutes of daily physical activity. Avoid "overtraining," which can cause mental disturbances  Assess your light exposure. Not enough natural light during the day and too much artificial light can have a major impact on your mood. Get outside as often as possible during daylight hours. Minimize light exposure after dark and avoid the use of electronics that give off blue light before bed  Manage your stress.  Use daily stress management techniques such as meditation, yoga, or mindfulness to retrain your brain to respond differently to stress. Try deep breathing to deactivate your "fight or flight" response.  There are many of sources with apps like Headspace, Calm or a variety of YouTube videos (videos from Gwynne Edinger have guided meditation)  Prioritize your social life. Work on building social support with new friends or improve current relationships. Consider getting a pet that allows for companionship, social interaction, and physical touch. Try volunteering or joining a faith-based community to increase your sense of purpose  Take time to play  Unstructured "play" time can help reduce anxiety and depression Options for play include music, games, sports, dance, art, etc.  Try to add daily omega 3 fatty acids, magnesium, B complex, and balanced amino acid supplements to help improve mood and anxiety.  Sleep hygiene is the single most effective treatment for sleep issues, but it is hard work.  Tips for a good night's sleep:  -Keep sleep environment comfortable and conducive to sleep -Keep regular sleep schedule 7 nights a week -Avoiding naps during the day -Avoiding going to bed until drowsy and ready to sleep, not trying to sleep, and not watching the clock -Get out of bed if not asleep within 15-20 minutes and returning only when drowsy -Avoiding caffeine, nicotine, alcohol, and other substances that interfere with sleep before bedtime -Take an hour before your set bedtime and start to wind down: bath/shower, no more TV or phone (the blue light can interfere with sleeping), listen to soothing music, or meditation -No TV in your bedroom -Exercising regularly, at least 6 hours before sleep. Yoga and Tai Chi can improve sleep quality  There are a lot of books and apps that may help guide you with any of the following:   -Progressive muscle relaxation (involves methodical tension and relaxation of different Muscle groups throughout body)  Guided imagery  -YouTube - Gwynne Edinger has free videos on YouTube that can help with meditation and some   Abdominal breathing   Over the counter sleep aid one hour before bed- and gradually wean your use over 2-4 weeks  Some examples are : *Melatonin 5-10 mg *Sleepology (Can find on [employer]) taken according to packaging directions  There are a few online evidence based online programs,  unfortunately they are not free.   Developed by a sleep expert who created a drug-free program for insomnia proven more effective than sleeping pills.  www.cbtforinsomnia.com Sleepio is an  evidence-based digital sleep improvement program   www.sleepio.com SHUTi is designed to actively help retrain your body and mind for great sleep through six engaging Cognitive Behavioral Therapy for Insomnia strategy and learning sessions  [email or web address]  Will call you when your results are received.  Call with any questions/concerns/needs

## 2019-07-09 NOTE — Progress Notes (Signed)
Subjective:    Patient ID: Sharon Ponce, female    DOB: [birth year], 49 y.o.   MRN: [id number]  Sharon Ponce is a 49 y.o. female presenting on 07/09/2019 for Annual Exam   Patient presents to clinic for annual wellness exam.   Health Maintenance:  Has not received her influenza vaccination this year, offered and declined. Is open to receiving COVID vaccination once returns to work, is currently working from home.  Has not had a mammogram in the past, discussed having mammogram this year, offered and declined due to having breast implants, discussed risks/benefits of continued self breast exams and offered breast ultrasound, accepted.  Last PAP testing was 04/06/2018 and was negative.  Will repeat PAP testing in 2022.  Has not yet scheduled with a dermatologist yet but requesting referral for skin spots that itch.  Has a history of having them removed in the past.  Follows with dentist and eye doctor yearly.  Depression screen [facility] 2/9 07/09/2019 03/08/2019 12/14/2018  Decreased Interest 1 0 0  Down, Depressed, Hopeless 0 0 0  PHQ - 2 Score 1 0 0  Altered sleeping 3 2 3   Tired, decreased energy 1 0 1  Change in appetite 0 0 0  Feeling bad or failure about yourself  0 0 0  Trouble concentrating 0 0 0  Moving slowly or fidgety/restless 0 0 0  Suicidal thoughts 0 0 0  PHQ-9 Score 5 2 4   Difficult doing work/chores Extremely dIfficult Not difficult at all Not difficult at all    Past Medical History:  Diagnosis Date  . Allergy   . Anxiety    Past Surgical History:  Procedure Laterality Date  . BUNIONECTOMY    . PLACEMENT OF BREAST IMPLANTS     Social History   Socioeconomic History  . Marital status: Single    Spouse name: Not on file  . Number of children: Not on file  . Years of education: Not on file  . Highest education level: Master's degree (e.g., MA, MS, MEng, MEd, MSW, MBA)  Occupational History  . Occupation: : [employer]  Tobacco Use  .  Smoking status: Current Every Day Smoker    Packs/day: 0.25    Years: 20.00    Pack years: 5.00    Types: Cigarettes  . Smokeless tobacco: Never Used  Substance and Sexual Activity  . Alcohol use: Never  . Drug use: Never  . Sexual activity: Not Currently    Birth control/protection: None  Other Topics Concern  . Not on file  Social History Narrative  . Not on file   Social Determinants of Health   Financial Resource Strain:   . Difficulty of Paying Living Expenses: Not on file  Food Insecurity:   . Worried About in the Last Year: Not on file  . Ran Out of Food in the Last Year: Not on file  Transportation Needs:   . Lack of Transportation (Medical): Not on file  . Lack of Transportation (Non-Medical): Not on file  Physical Activity:   . Days of Exercise per Week: Not on file  . Minutes of Exercise per Session: Not on file  Stress:   . Feeling of Stress : Not on file  Social Connections:   . Frequency of Communication with Friends and Family: Not on file  . Frequency of Social Gatherings with Friends and Family: Not on file  . Attends Religious Services: Not on file  . Active  Member of Clubs or Organizations: Not on file  . Attends Banker Meetings: Not on file  . Marital Status: Not on file  Intimate Partner Violence:   . Fear of Current or Ex-Partner: Not on file  . Emotionally Abused: Not on file  . Physically Abused: Not on file  . Sexually Abused: Not on file   Family History  Problem Relation Age of Onset  . Healthy Mother   . Healthy Father   . Leukemia Maternal Grandmother   . Heart disease Maternal Grandfather   . Breast cancer Neg Hx   . Colon cancer Neg Hx   . Stroke Neg Hx    Current Outpatient Medications on File Prior to Visit  Medication Sig  . montelukast (SINGULAIR) 10 MG tablet Take 1 tablet (10 mg total) by mouth at bedtime.  . Multiple Vitamin (MULTIVITAMIN) tablet Take 1 tablet by mouth daily.  . Omega-3  Fatty Acids (OMEGA-3 FISH OIL PO) Take by mouth.  . Potassium (POTASSIMIN PO) Take by mouth.  Marland Kitchen PREVIDENT 5000 SENSITIVE 1.1-5 % PSTE USE EVERY PM AFTER BRUSHING AND FLOSSING. SPIT OUT EXCESS. NO RINSE/EAT/DRINK FOR AT LEAST 30 MINUTES AFTER APPLICATION  . SUMAtriptan (IMITREX) 50 MG tablet Take 1 tablet (50 mg total) by mouth once as needed for up to 1 dose for migraine. May repeat one dose in 2 hours if headache persists, for max dose 24 hours  . Turmeric (CURCUMIN 95 PO) Take by mouth.  . venlafaxine XR (EFFEXOR-XR) 150 MG 24 hr capsule Take 1 capsule (150 mg total) by mouth daily with breakfast.  . vitamin C (ASCORBIC ACID) 500 MG tablet Take 500 mg by mouth daily.   No current facility-administered medications on file prior to visit.    Per HPI unless specifically indicated above     Objective:    BP 107/72   Pulse 75   Temp 98.4 F (36.9 C) (Oral)   Ht 5\' 4"  (1.626 m)   Wt 157 lb (71.2 kg)   LMP 06/25/2019   BMI 26.95 kg/m   Wt Readings from Last 3 Encounters:  07/09/19 157 lb (71.2 kg)  03/08/19 153 lb 6.4 oz (69.6 kg)  12/14/18 145 lb (65.8 kg)    Physical Exam Vitals and nursing note reviewed.  Constitutional:      General: She is not in acute distress.    Appearance: Normal appearance. She is normal weight. She is not ill-appearing, toxic-appearing or diaphoretic.  HENT:     Head: Normocephalic and atraumatic.     Right Ear: Tympanic membrane, ear canal and external ear normal. There is no impacted cerumen.     Left Ear: Tympanic membrane, ear canal and external ear normal. There is no impacted cerumen.  Eyes:     General: Lids are normal. Lids are everted, no foreign bodies appreciated. Vision grossly intact. No scleral icterus.       Right eye: No discharge.        Left eye: No discharge.     Extraocular Movements: Extraocular movements intact.     Conjunctiva/sclera: Conjunctivae normal.     Pupils: Pupils are equal, round, and reactive to light.  Neck:      Thyroid: No thyroid mass or thyromegaly.  Cardiovascular:     Rate and Rhythm: Normal rate and regular rhythm.     Pulses: Normal pulses.     Heart sounds: Normal heart sounds. No murmur. No friction rub. No gallop.   Pulmonary:  Effort: Pulmonary effort is normal.     Breath sounds: Normal breath sounds.  Abdominal:     General: Abdomen is flat. Bowel sounds are normal. There is no distension.     Palpations: Abdomen is soft. There is no mass.     Tenderness: There is no right CVA tenderness, left CVA tenderness or rebound.  Musculoskeletal:        General: Normal range of motion.     Cervical back: Full passive range of motion without pain, normal range of motion and neck supple.  Lymphadenopathy:     Cervical: No cervical adenopathy.  Skin:    General: Skin is warm and dry.     Capillary Refill: Capillary refill takes less than 2 seconds.     Coloration: Skin is not ashen or jaundiced.     Findings: Lesion (Multiple Sebhorreic Keratoses ) present.     Nails: There is no clubbing.  Neurological:     General: No focal deficit present.     Mental Status: She is alert and oriented to person, place, and time. Mental status is at baseline.     Cranial Nerves: Cranial nerves are intact. No cranial nerve deficit.     Sensory: Sensation is intact. No sensory deficit.     Motor: Motor function is intact. No weakness.     Coordination: Coordination is intact.     Gait: Gait is intact. Gait normal.  Psychiatric:        Attention and Perception: Attention and perception normal.        Mood and Affect: Mood and affect normal.        Speech: Speech normal.        Behavior: Behavior normal. Behavior is cooperative.        Thought Content: Thought content normal.        Cognition and Memory: Cognition and memory normal.        Judgment: Judgment normal.    Results for orders placed or performed in visit on 07/09/19  POCT Urinalysis Dipstick  Result Value Ref Range   Color, UA Yellow     Clarity, UA clear    Glucose, UA Negative Negative   Bilirubin, UA Negative    Ketones, UA Negative    Spec Grav, UA <=1.005 (A) 1.010 - 1.025   Blood, UA trace    pH, UA 5.0 5.0 - 8.0   Protein, UA Negative Negative   Urobilinogen, UA 0.2 0.2 or 1.0 E.U./dL   Nitrite, UA positive    Leukocytes, UA Negative Negative   Appearance     Odor        Assessment & Plan:   Problem List Items Addressed This Visit      Other   Primary insomnia   Relevant Medications   temazepam (RESTORIL) 15 MG capsule   Routine general medical examination at health care facility - Primary    Reviewed health guidelines. Offered and declined influenza vaccination. Awaiting returning to work before obtaining COVID vaccine. Will refer to dermatology for establishing care and for Seborrheic Keratoses management. Offered and declined Mammogram, open to breast ultrasound for screening. Follows yearly for dental and routine eye exams.  Insomnia is stable and well controlled with Restoril, taking 5-7 nights per week, waking well rested without feeling groggy.  Denies snoring or waking up choking.  Anxiety is stable and well managed with Effexor XR 150mg  taken daily.  Has continued to take Singulair and Flonase for seasonal allergies, with good relief  of symptoms.  Routine annual urinalysis showing some trace nitrates and some hematuria.  Will send urine for culture and repeat urinalysis with reflex to microscopy with the lab.  Will plan to repeat the urinalysis in 1 week and if showing continued hematuria then will refer to Urology for further work up.  Patient in agreement with treatment plan.      Relevant Orders   POCT Urinalysis Dipstick (Completed)   CBC with Differential   Comprehensive Metabolic Panel (CMET)   Lipid Profile   Thyroid Panel With TSH    Other Visit Diagnoses    Hematuria, unspecified type       Relevant Orders   Urine Culture   Urinalysis, Routine w reflex microscopic    Encounter for screening for malignant neoplasm of breast, unspecified screening modality       Relevant Orders   [place] Unlisted Procedure Breast   Seborrheic keratoses       Relevant Orders   Ambulatory referral to Dermatology   Seasonal allergic rhinitis due to other allergic trigger       Relevant Medications   fluticasone (FLONASE) 50 MCG/ACT nasal spray      Meds ordered this encounter  Medications  . temazepam (RESTORIL) 15 MG capsule    Sig: Take 1 capsule (15 mg total) by mouth at bedtime as needed for sleep.    Dispense:  30 capsule    Refill:  1    Order Specific Question:   Supervising Provider    Answer:   Olin Hauser [[id number]]  . DISCONTD: fluticasone (FLONASE) 50 MCG/ACT nasal spray    Sig: SHAKE LQ AND U 2 SPRAYS IEN QD    Dispense:  15.8 mL    Refill:  3    Order Specific Question:   Supervising Provider    Answer:   Olin Hauser [[id number]]  . fluticasone (FLONASE) 50 MCG/ACT nasal spray    Sig: SHAKE LQ AND U 2 SPRAYS IEN QD    Dispense:  15.8 mL    Refill:  3    Order Specific Question:   Supervising Provider    Answer:   Olin Hauser [[id number]]      Follow up plan: Return in about 1 year (around 07/08/2020) for Anxiety/Anxiety.   Will repeat urinalysis in 1 week and have annual labs drawn at that time.  Will call with the results.  Call us with any questions/concerns/needs.  Harlin Rain, FNP-C Family Nurse Practitioner [facility] 07/09/2019, 2:54 PM

## 2019-07-09 NOTE — Progress Notes (Signed)
I have reviewed this encounter including the documentation in this note and/or discussed this patient with the provider, Nicole Malfi FNP. I am certifying that I agree with the content of this note as supervising physician.  Christophr Calix, DO [facility] 07/09/2019, 5:24 PM  

## 2019-07-09 NOTE — Assessment & Plan Note (Addendum)
Reviewed health guidelines. Offered and declined influenza vaccination. Awaiting returning to work before obtaining COVID vaccine. Will refer to dermatology for establishing care and for Seborrheic Keratoses management. Offered and declined Mammogram, open to breast ultrasound for screening. Follows yearly for dental and routine eye exams.  Insomnia is stable and well controlled with Restoril, taking 5-7 nights per week, waking well rested without feeling groggy.  Denies snoring or waking up choking.  Anxiety is stable and well managed with Effexor XR 150mg  taken daily.  Has continued to take Singulair and Flonase for seasonal allergies, with good relief of symptoms.  Routine annual urinalysis showing some trace nitrates and some hematuria.  Will send urine for culture and repeat urinalysis with reflex to microscopy with the lab.  Will plan to repeat the urinalysis in 1 week and if showing continued hematuria then will refer to Urology for further work up.  Patient in agreement with treatment plan.

## 2019-07-11 LAB — URINALYSIS, ROUTINE W REFLEX MICROSCOPIC
Bilirubin Urine: NEGATIVE
Glucose, UA: NEGATIVE
Hgb urine dipstick: NEGATIVE
Hyaline Cast: NONE SEEN /LPF
Ketones, ur: NEGATIVE
Leukocytes,Ua: NEGATIVE
Nitrite: POSITIVE — AB
Protein, ur: NEGATIVE
RBC / HPF: NONE SEEN /HPF (ref 0–2)
Specific Gravity, Urine: 1.019 (ref 1.001–1.03)
WBC, UA: NONE SEEN /HPF (ref 0–5)
pH: 7.5 (ref 5.0–8.0)

## 2019-07-11 LAB — URINE CULTURE
MICRO NUMBER:: 10167728
SPECIMEN QUALITY:: ADEQUATE

## 2019-07-12 ENCOUNTER — Other Ambulatory Visit: Payer: Self-pay | Admitting: Family Medicine

## 2019-07-12 ENCOUNTER — Other Ambulatory Visit: Payer: Federal, State, Local not specified - PPO

## 2019-07-12 MED ORDER — NITROFURANTOIN MONOHYD MACRO 100 MG PO CAPS: 100.0000 mg | ORAL_CAPSULE | Freq: Two times a day (BID) | ORAL | 0 refills | Status: AC

## 2019-07-12 NOTE — Progress Notes (Signed)
Left message for patient to return call to clinic to discuss results and that I would be sending a message through the patient portal.   Urine showing UTI - sent in Rx for Macrobid 100mg  - 1 tablet 2x a day for the next 5 days.

## 2019-07-13 ENCOUNTER — Other Ambulatory Visit: Payer: Federal, State, Local not specified - PPO

## 2019-07-23 ENCOUNTER — Other Ambulatory Visit: Payer: Federal, State, Local not specified - PPO

## 2019-07-23 ENCOUNTER — Other Ambulatory Visit: Payer: Self-pay

## 2019-07-23 DIAGNOSIS — Z0000 Encounter for general adult medical examination without abnormal findings: Secondary | ICD-10-CM | POA: Diagnosis not present

## 2019-07-23 DIAGNOSIS — R5383 Other fatigue: Secondary | ICD-10-CM | POA: Diagnosis not present

## 2019-07-23 DIAGNOSIS — R319 Hematuria, unspecified: Secondary | ICD-10-CM | POA: Diagnosis not present

## 2019-07-25 LAB — LIPID PANEL
Cholesterol: 224 mg/dL — ABNORMAL HIGH (ref <200)
HDL: 73 mg/dL (ref >=50)
LDL Cholesterol (Calc): 136 mg/dL (calc) — ABNORMAL HIGH
Non-HDL Cholesterol (Calc): 151 mg/dL (calc) — ABNORMAL HIGH (ref <130)
Total CHOL/HDL Ratio: 3.1 (calc) (ref <5.0)
Triglycerides: 48 mg/dL (ref <150)

## 2019-07-25 LAB — URINALYSIS, ROUTINE W REFLEX MICROSCOPIC
Bilirubin Urine: NEGATIVE
Glucose, UA: NEGATIVE
Hgb urine dipstick: NEGATIVE
Ketones, ur: NEGATIVE
Leukocytes,Ua: NEGATIVE
Nitrite: NEGATIVE
Protein, ur: NEGATIVE
Specific Gravity, Urine: 1.01 (ref 1.001–1.03)
pH: 6.5 (ref 5.0–8.0)

## 2019-07-25 LAB — COMPREHENSIVE METABOLIC PANEL
AG Ratio: 1.7 (calc) (ref 1.0–2.5)
ALT: 10 U/L (ref 6–29)
AST: 15 U/L (ref 10–35)
Albumin: 4 g/dL (ref 3.6–5.1)
Alkaline phosphatase (APISO): 47 U/L (ref 37–153)
BUN: 16 mg/dL (ref 7–25)
CO2: 24 mmol/L (ref 20–32)
Calcium: 9.3 mg/dL (ref 8.6–10.4)
Chloride: 104 mmol/L (ref 98–110)
Creat: 0.66 mg/dL (ref 0.50–1.05)
Globulin: 2.4 g/dL (calc) (ref 1.9–3.7)
Glucose, Bld: 93 mg/dL (ref 65–99)
Potassium: 4.2 mmol/L (ref 3.5–5.3)
Sodium: 136 mmol/L (ref 135–146)
Total Bilirubin: 0.3 mg/dL (ref 0.2–1.2)
Total Protein: 6.4 g/dL (ref 6.1–8.1)

## 2019-07-25 LAB — CBC WITH DIFFERENTIAL/PLATELET
Absolute Monocytes: 502 cells/uL (ref 200–950)
Basophils Absolute: 62 cells/uL (ref 0–200)
Basophils Relative: 0.7 %
Eosinophils Absolute: 378 cells/uL (ref 15–500)
Eosinophils Relative: 4.3 %
HCT: 37 % (ref 35.0–45.0)
Hemoglobin: 12.9 g/dL (ref 11.7–15.5)
Lymphs Abs: 3265 cells/uL (ref 850–3900)
MCH: 30.6 pg (ref 27.0–33.0)
MCHC: 34.9 g/dL (ref 32.0–36.0)
MCV: 87.9 fL (ref 80.0–100.0)
MPV: 10.8 fL (ref 7.5–12.5)
Monocytes Relative: 5.7 %
Neutro Abs: 4594 cells/uL (ref 1500–7800)
Neutrophils Relative %: 52.2 %
Platelets: 269 10*3/uL (ref 140–400)
RBC: 4.21 10*6/uL (ref 3.80–5.10)
RDW: 13.6 % (ref 11.0–15.0)
Total Lymphocyte: 37.1 %
WBC: 8.8 10*3/uL (ref 3.8–10.8)

## 2019-07-25 LAB — URINE CULTURE
MICRO NUMBER:: 10220906
SPECIMEN QUALITY:: ADEQUATE

## 2019-07-25 LAB — THYROID PANEL WITH TSH
Free Thyroxine Index: 2.9 (ref 1.4–3.8)
T3 Uptake: 31 % (ref 22–35)
T4, Total: 9.4 ug/dL (ref 5.1–11.9)
TSH: 1.37 mIU/L

## 2019-07-26 ENCOUNTER — Telehealth: Payer: Self-pay

## 2019-07-26 ENCOUNTER — Other Ambulatory Visit: Payer: Self-pay | Admitting: Family Medicine

## 2019-07-26 DIAGNOSIS — N390 Urinary tract infection, site not specified: Secondary | ICD-10-CM

## 2019-07-26 MED ORDER — SULFAMETHOXAZOLE-TRIMETHOPRIM 800-160 MG PO TABS: 1.0000 | ORAL_TABLET | Freq: Two times a day (BID) | ORAL | 0 refills | Status: DC

## 2019-07-26 NOTE — Telephone Encounter (Signed)
The pt was notified of her lab results and she verbalize understanding. She did take the Macrobid as prescribed. She state in the past she has had UTI's that did not resolved with one round of abx. She said they had to use a strong abx to resolve it.

## 2019-07-26 NOTE — Progress Notes (Signed)
Sent in Rx for Bactrim DS, 1 tablet 2x a day for the next 5 days.  Had completed Macrobid as directed with continued lab results showing acute infection.

## 2019-07-26 NOTE — Telephone Encounter (Signed)
I sent in Bactrim DS, 1 tablet 2x a day for the next 5 days to her pharmacy.  Thanks

## 2019-07-27 ENCOUNTER — Other Ambulatory Visit: Payer: Self-pay | Admitting: Family Medicine

## 2019-07-27 ENCOUNTER — Telehealth: Payer: Self-pay

## 2019-07-27 DIAGNOSIS — N390 Urinary tract infection, site not specified: Secondary | ICD-10-CM

## 2019-07-27 MED ORDER — SULFAMETHOXAZOLE-TRIMETHOPRIM 800-160 MG PO TABS: 1.0000 | ORAL_TABLET | Freq: Two times a day (BID) | ORAL | 0 refills | Status: AC

## 2019-07-27 NOTE — Progress Notes (Signed)
Prescription was sent in incorrectly, updated and resent

## 2019-07-27 NOTE — Telephone Encounter (Signed)
Left detail message on the patient vm.

## 2019-07-27 NOTE — Telephone Encounter (Signed)
Error

## 2019-09-22 ENCOUNTER — Other Ambulatory Visit: Payer: Self-pay | Admitting: Nurse Practitioner

## 2019-09-22 ENCOUNTER — Other Ambulatory Visit: Payer: Self-pay | Admitting: Family Medicine

## 2019-09-22 DIAGNOSIS — F419 Anxiety disorder, unspecified: Secondary | ICD-10-CM

## 2019-09-22 DIAGNOSIS — G43719 Chronic migraine without aura, intractable, without status migrainosus: Secondary | ICD-10-CM

## 2019-09-22 DIAGNOSIS — F5101 Primary insomnia: Secondary | ICD-10-CM

## 2019-09-22 NOTE — Telephone Encounter (Signed)
Requested Prescriptions  Pending Prescriptions Disp Refills  . venlafaxine XR (EFFEXOR-XR) 150 MG 24 hr capsule [Pharmacy Med Name: VENLAFAXINE 150MG  ER CAPSULES] 90 capsule 1    Sig: TAKE 1 CAPSULE(150 MG) BY MOUTH DAILY WITH BREAKFAST     Psychiatry: Antidepressants - SNRI - desvenlafaxine & venlafaxine Failed - 09/22/2019 10:18 AM      Failed - LDL in normal range and within 360 days    LDL Cholesterol (Calc)  Date Value Ref Range Status  07/23/2019 136 (H) mg/dL (calc) Final    Comment:    Reference range: <100 . Desirable range <100 mg/dL for primary prevention;   <70 mg/dL for patients with CHD or diabetic patients  with > or = 2 CHD risk factors. 09/22/2019 LDL-C is now calculated using the Martin-Hopkins  calculation, which is a validated novel method providing  better accuracy than the Friedewald equation in the  estimation of LDL-C.  Marland Kitchen et al. Horald Pollen. Lenox Ahr): 2061-2068  (http://education.[employer].com/faq/FAQ164)          Failed - Total Cholesterol in normal range and within 360 days    Cholesterol  Date Value Ref Range Status  07/23/2019 224 (H) <200 mg/dL Final         Failed - Valid encounter within last 6 months    Recent Outpatient Visits          2 months ago Routine general medical examination at health care facility   [facility], [facility], FNP   6 months ago Non-seasonal allergic rhinitis, unspecified trigger   [facility], NP   9 months ago Primary insomnia   [facility], NP   11 months ago Intractable chronic migraine without aura and without status migrainosus   [facility] [place], [place], DO   1 year ago Anxiety   [facility], Kyung Rudd, NP             Passed - Triglycerides in normal range and within 360 days    Triglycerides  Date Value Ref Range Status  07/23/2019 48 <150 mg/dL  Final         Passed - Last BP in normal range    BP Readings from Last 1 Encounters:  07/09/19 107/72

## 2019-09-22 NOTE — Telephone Encounter (Signed)
[place]ally [place], 09/22/19.  According to PMP aware, pt last received a refill on 08/25/2019.  Refill of her controlled substance will be provided today.

## 2019-09-22 NOTE — Telephone Encounter (Signed)
Requested medication (s) are due for refill today: yes  Requested medication (s) are on the active medication list: yes  Last refill: 08/25/19  Future visit scheduled: yes  Notes to clinic: not delegated   Requested Prescriptions  Pending Prescriptions Disp Refills   temazepam (RESTORIL) 15 MG capsule [Pharmacy Med Name: TEMAZEPAM 15MG  CAPSULES] 30 capsule     Sig: TAKE 1 CAPSULE(15 MG) BY MOUTH AT BEDTIME AS NEEDED FOR SLEEP      Not Delegated - Psychiatry:  Anxiolytics/Hypnotics Failed - 09/22/2019  7:08 AM      Failed - This refill cannot be delegated      Failed - Urine Drug Screen completed in last 360 days.      Failed - Valid encounter within last 6 months    Recent Outpatient Visits           2 months ago Routine general medical examination at health care facility   [facility], [facility], FNP   6 months ago Non-seasonal allergic rhinitis, unspecified trigger   [facility], NP   9 months ago Primary insomnia   [facility], NP   11 months ago Intractable chronic migraine without aura and without status migrainosus   [facility] [place], [place], DO   1 year ago Anxiety   [facility], Kyung Rudd, NP

## 2019-10-20 ENCOUNTER — Other Ambulatory Visit: Payer: Self-pay | Admitting: Family Medicine

## 2019-10-20 DIAGNOSIS — F5101 Primary insomnia: Secondary | ICD-10-CM

## 2019-10-21 NOTE — Telephone Encounter (Signed)
Personally reviewed [place] today, 10/21/19.  According to PMP aware, pt last received a refill on 09/22/2019.  Refill of her controlled substance will be provided today.

## 2019-11-18 ENCOUNTER — Ambulatory Visit: Payer: Federal, State, Local not specified - PPO | Admitting: Family Medicine

## 2019-11-18 ENCOUNTER — Other Ambulatory Visit: Payer: Self-pay | Admitting: Family Medicine

## 2019-11-18 DIAGNOSIS — F5101 Primary insomnia: Secondary | ICD-10-CM

## 2019-11-18 NOTE — Telephone Encounter (Signed)
Personally reviewed [place] today, 11/18/19.  According to PMP aware, pt last received a refill on 10/21/2019.  Refill of her controlled substance will be provided today.

## 2019-11-18 NOTE — Telephone Encounter (Signed)
Requested medication (s) are due for refill today: yes  Requested medication (s) are on the active medication list: yes  Last refill:  10/21/19 #30  Future visit scheduled: yes  Notes to clinic:  Refill not delegated per protocol. Please review for refill    Requested Prescriptions  Pending Prescriptions Disp Refills   temazepam (RESTORIL) 15 MG capsule [Pharmacy Med Name: TEMAZEPAM 15MG  CAPSULES] 30 capsule     Sig: TAKE 1 CAPSULE(15 MG) BY MOUTH AT BEDTIME AS NEEDED FOR SLEEP      Not Delegated - Psychiatry:  Anxiolytics/Hypnotics Failed - 11/18/2019  7:21 AM      Failed - This refill cannot be delegated      Failed - Urine Drug Screen completed in last 360 days.      Passed - Valid encounter within last 6 months    Recent Outpatient Visits           4 months ago Routine general medical examination at health care facility   [facility], [facility], FNP   8 months ago Non-seasonal allergic rhinitis, unspecified trigger   [facility], NP   11 months ago Primary insomnia   [facility], Kyung Rudd, NP   1 year ago Intractable chronic migraine without aura and without status migrainosus   [facility] [place], [place], DO   1 year ago Anxiety   [facility], Kyung Rudd, NP       Future Appointments             In 7 months Malfi, Alison Stalling, FNP [facility], [facility]

## 2019-12-17 ENCOUNTER — Other Ambulatory Visit: Payer: Self-pay | Admitting: Family Medicine

## 2019-12-17 DIAGNOSIS — F5101 Primary insomnia: Secondary | ICD-10-CM

## 2019-12-17 NOTE — Telephone Encounter (Signed)
Personally reviewed [place], 12/17/19.  According to PMP aware, pt last received a refill on 11/18/2019.  Refill of her controlled substance will be provided today.  

## 2019-12-17 NOTE — Telephone Encounter (Signed)
Requested medication (s) are due for refill today:  Yes  Requested medication (s) are on the active medication list:  Yes  Future visit scheduled:  Yes  Last Refill: 11/18/19; #30/ no refills  Note to clinic:  Medication is not delegated.  Requested Prescriptions  Pending Prescriptions Disp Refills   temazepam (RESTORIL) 15 MG capsule [Pharmacy Med Name: TEMAZEPAM 15MG  CAPSULES] 30 capsule     Sig: TAKE 1 CAPSULE(15 MG) BY MOUTH AT BEDTIME AS NEEDED FOR SLEEP      Not Delegated - Psychiatry:  Anxiolytics/Hypnotics Failed - 12/17/2019 12:46 PM      Failed - This refill cannot be delegated      Failed - Urine Drug Screen completed in last 360 days.      Passed - Valid encounter within last 6 months    Recent Outpatient Visits           5 months ago Routine general medical examination at health care facility   [facility], [facility], FNP   9 months ago Non-seasonal allergic rhinitis, unspecified trigger   [facility], NP   1 year ago Primary insomnia   [facility], NP   1 year ago Intractable chronic migraine without aura and without status migrainosus   [facility] [place], DO   1 year ago Anxiety   [facility], Kyung Rudd, NP       Future Appointments             In 6 months M[place], Alison Stalling, FNP [facility], [facility]

## 2020-01-03 ENCOUNTER — Ambulatory Visit (INDEPENDENT_AMBULATORY_CARE_PROVIDER_SITE_OTHER): Payer: Federal, State, Local not specified - PPO | Admitting: Family Medicine

## 2020-01-03 ENCOUNTER — Other Ambulatory Visit: Payer: Self-pay

## 2020-01-03 ENCOUNTER — Ambulatory Visit: Payer: Federal, State, Local not specified - PPO | Admitting: Family Medicine

## 2020-01-03 ENCOUNTER — Encounter: Payer: Self-pay | Admitting: Family Medicine

## 2020-01-03 VITALS — BP 103/53 | HR 69 | Temp 97.7°F | Resp 16 | Ht 64.0 in | Wt 153.0 lb

## 2020-01-03 DIAGNOSIS — G43719 Chronic migraine without aura, intractable, without status migrainosus: Secondary | ICD-10-CM

## 2020-01-03 DIAGNOSIS — B029 Zoster without complications: Secondary | ICD-10-CM

## 2020-01-03 MED ORDER — SUMATRIPTAN SUCCINATE 50 MG PO TABS: 50.0000 mg | ORAL_TABLET | Freq: Once | ORAL | 2 refills | Status: DC | PRN

## 2020-01-03 MED ORDER — VALACYCLOVIR HCL 1 G PO TABS: 1000.0000 mg | ORAL_TABLET | Freq: Three times a day (TID) | ORAL | 0 refills | Status: DC

## 2020-01-03 MED ORDER — GABAPENTIN 100 MG PO CAPS: ORAL_CAPSULE | ORAL | 0 refills | Status: DC

## 2020-01-03 NOTE — Progress Notes (Signed)
Subjective:    Patient ID: Sharon Ponce, female    DOB: 13-Dec-1970, 49 y.o.   MRN: [id number]  Sharon Ponce is a 49 y.o. female presenting on 01/03/2020 for Herpes Zoster (rash Right side--facial and scalp area)  Patient presents for a same day appointment.  PCP Danielle Rankin, FNP  HPI   Acute Shingles New problem, with acute R scalp and head down to R eye brow and eyelid area rash red patchy vesicular rash with burning and stinging pain. New onset in past few days. She was advised may have shingles. Here for acute visit. She has denied having chicken pox before, but says probably exposed to it was around others that had it. She has never had acute shingles before. She has never had shingles vaccine. - Admits stress with her job end of fiscal year affecting her and says very stress right now caused this - Not taking any medicine for this right now. - Admits burning on her scalp worse to touch with pain, and burning on eyelid - She denies it actually involving her Right Eye. She says not affecting her vision. Not affecting other areas. - She has an eye doctor, Dr Clydene Pugh, but has not been in a while, for glasses, she may schedule soon Denies any nausea vomiting, other areas of rash  Migraines Chronic problem, previously discussed with me in 09/2018, see prior note for further details needs refill of sumatriptan imitrex, was only taking PRN Admits headache  Health Maintenance: UTD COVID vaccine  Depression screen [facility] 2/9 07/09/2019 03/08/2019 12/14/2018  Decreased Interest 1 0 0  Down, Depressed, Hopeless 0 0 0  PHQ - 2 Score 1 0 0  Altered sleeping Tired, decreased energy 1 0 1  Change in appetite 0 0 0  Feeling bad or failure about yourself  0 0 0  Trouble concentrating 0 0 0  Moving slowly or fidgety/restless 0 0 0  Suicidal thoughts 0 0 0  PHQ-9 Score Difficult doing work/chores Extremely dIfficult Not difficult at all Not difficult at all    Social History    Tobacco Use  . Smoking status: Current Every Day Smoker    Packs/day: 0.25    Years: 20.00    Pack years: 5.00    Types: Cigarettes  . Smokeless tobacco: Never Used  Vaping Use  . Vaping Use: Never used  Substance Use Topics  . Alcohol use: Never  . Drug use: Never    Review of Systems Per HPI unless specifically indicated above     Objective:    BP (!) 103/53   Pulse 69   Temp 97.7 F (36.5 C) (Temporal)   Resp 16   Ht  (1.626 m)   Wt 153 lb (69.4 kg)   BMI 26.26 kg/m   Wt Readings from Last 3 Encounters:  01/03/20 153 lb (69.4 kg)  07/09/19 157 lb (71.2 kg)  03/08/19 153 lb 6.4 oz (69.6 kg)    Physical Exam Vitals and nursing note reviewed.  Constitutional:      General: She is not in acute distress.    Appearance: She is well-developed. She is not diaphoretic.     Comments: Well-appearing, comfortable, cooperative  HENT:     Head: Normocephalic and atraumatic.  Eyes:     General:        Right eye: No discharge.        Left eye: No discharge.     Extraocular  Movements: Extraocular movements intact.     Conjunctiva/sclera: Conjunctivae normal.     Pupils: Pupils are equal, round, and reactive to light.  Cardiovascular:     Rate and Rhythm: Normal rate.  Pulmonary:     Effort: Pulmonary effort is normal.  Skin:    General: Skin is warm and dry.     Findings: Rash (Right scalp distinct patches erythematous vesicular rashes in dermatomal pattern on top of scalp on R side only and going down onto R eyebrow and R eyelid mild, not affecting R eye) present. No erythema.  Neurological:     Mental Status: She is alert and oriented to person, place, and time.  Psychiatric:        Behavior: Behavior normal.     Comments: Well groomed, good eye contact, normal speech and thoughts     ________________________________________________________ PROCEDURE NOTE Date: 01/03/20 Fluorescein Dye Test Discussed benefits (diagnostic testing for shingles, corneal  abrasion) and risks (including eye irritation) Verbal consent given by patient. Medication:  Fluorescein Dye Paper, with 1-2 drops of sterile saline  Right eye is the affected side and patient is laying supine on exam table with affected eye wide open. 1-2 drops of sterile saline from a single use individual dropper was used on the tip of the newly opened Fluorescein Dye Stick. The corner of the dye stick was touched against the bottom outside corner of the sclera of the affected eye. Dye appeared to be present on the eye, and patient moved eye around to disperse the dye. Lights were turned off. Black light with magnifier was plugged in and turned on to illuminate the affected eye with black light. The Fluorescein Dye pattern observed was normal, without any focal uptake of dye. Patient tolerated well.    Results for orders placed or performed in visit on 07/09/19  Urine Culture   Specimen: Urine  Result Value Ref Range   MICRO NUMBER: [id number]    SPECIMEN QUALITY: Adequate    Sample Source URINE    STATUS: FINAL    ISOLATE 1: Escherichia coli (A)       Susceptibility   Escherichia coli - URINE CULTURE, REFLEX    AMOX/CLAVULANIC <=2 Sensitive     AMPICILLIN <=2 Sensitive     AMPICILLIN/SULBACTAM <=2 Sensitive     CEFAZOLIN* <=4 Not Reportable      * For infections other than uncomplicated UTIcaused by E. coli, K. pneumoniae or P. mirabilis:Cefazolin is resistant if MIC > or = 8 mcg/mL.(Distinguishing susceptible versus intermediatefor isolates with MIC < or = 4 mcg/mL requiresadditional testing.)For uncomplicated UTI caused by E. coli,K. pneumoniae or P. mirabilis: Cefazolin issusceptible if MIC <32 mcg/mL and predictssusceptible to the oral agents cefaclor, cefdinir,cefpodoxime, cefprozil, cefuroxime, cephalexinand loracarbef.    CEFEPIME <=1 Sensitive     CEFTRIAXONE <=1 Sensitive     CIPROFLOXACIN <=0.25 Sensitive     LEVOFLOXACIN <=0.12 Sensitive     ERTAPENEM <=0.5 Sensitive      GENTAMICIN <=1 Sensitive     IMIPENEM <=0.25 Sensitive     NITROFURANTOIN <=16 Sensitive     PIP/TAZO <=4 Sensitive     TOBRAMYCIN <=1 Sensitive     TRIMETH/SULFA* <=20 Sensitive      * For infections other than uncomplicated UTIcaused by E. coli, K. pneumoniae or P. mirabilis:Cefazolin is resistant if MIC > or = 8 mcg/mL.(Distinguishing susceptible versus intermediatefor isolates with MIC < or = 4 mcg/mL requiresadditional testing.)For uncomplicated UTI caused by E. coli,K. pneumoniae or P. mirabilis: Cefazolin  issusceptible if MIC <32 mcg/mL and predictssusceptible to the oral agents cefaclor, cefdinir,cefpodoxime, cefprozil, cefuroxime, cephalexinand loracarbef.Legend:S = Susceptible  I = IntermediateR = Resistant  NS = Not susceptible* = Not tested  NR = Not reported**NN = See antimicrobic comments  Urine Culture  Result Value Ref Range   MICRO NUMBER: [id number]    SPECIMEN QUALITY: Adequate    Sample Source URINE    STATUS: FINAL    ISOLATE 1: Escherichia coli (A)       Susceptibility   Escherichia coli - URINE CULTURE, REFLEX    AMOX/CLAVULANIC <=2 Sensitive     AMPICILLIN <=2 Sensitive     AMPICILLIN/SULBACTAM <=2 Sensitive     CEFAZOLIN* <=4 Not Reportable      * For infections other than uncomplicated UTIcaused by E. coli, K. pneumoniae or P. mirabilis:Cefazolin is resistant if MIC > or = 8 mcg/mL.(Distinguishing susceptible versus intermediatefor isolates with MIC < or = 4 mcg/mL requiresadditional testing.)For uncomplicated UTI caused by E. coli,K. pneumoniae or P. mirabilis: Cefazolin issusceptible if MIC <32 mcg/mL and predictssusceptible to the oral agents cefaclor, cefdinir,cefpodoxime, cefprozil, cefuroxime, cephalexinand loracarbef.    CEFEPIME <=1 Sensitive     CEFTRIAXONE <=1 Sensitive     CIPROFLOXACIN <=0.25 Sensitive     LEVOFLOXACIN <=0.12 Sensitive     ERTAPENEM <=0.5 Sensitive     GENTAMICIN <=1 Sensitive     IMIPENEM <=0.25 Sensitive     NITROFURANTOIN <=16  Sensitive     PIP/TAZO <=4 Sensitive     TOBRAMYCIN <=1 Sensitive     TRIMETH/SULFA* <=20 Sensitive      * For infections other than uncomplicated UTIcaused by E. coli, K. pneumoniae or P. mirabilis:Cefazolin is resistant if MIC > or = 8 mcg/mL.(Distinguishing susceptible versus intermediatefor isolates with MIC < or = 4 mcg/mL requiresadditional testing.)For uncomplicated UTI caused by E. coli,K. pneumoniae or P. mirabilis: Cefazolin issusceptible if MIC <32 mcg/mL and predictssusceptible to the oral agents cefaclor, cefdinir,cefpodoxime, cefprozil, cefuroxime, cephalexinand loracarbef.Legend:S = Susceptible  I = IntermediateR = Resistant  NS = Not susceptible* = Not tested  NR = Not reported**NN = See antimicrobic comments  CBC with Differential  Result Value Ref Range   WBC 8.8 3.8 - 10.8 Thousand/uL   RBC 4.21 3.80 - 5.10 Million/uL   Hemoglobin 12.9 11.7 - 15.5 g/dL   HCT 03.4 35 - 45 %   MCV 87.9 80.0 - 100.0 fL   MCH 30.6 27.0 - 33.0 pg   MCHC 34.9 32.0 - 36.0 g/dL   RDW 74.2 59.5 - 63.8 %   Platelets 269 140 - 400 Thousand/uL   MPV 10.8 7.5 - 12.5 fL   Neutro Abs 4,594 1,500 - 7,800 cells/uL   Lymphs Abs 3,265 850 - 3,900 cells/uL   Absolute Monocytes 502 200 - 950 cells/uL   Eosinophils Absolute 378 15 - 500 cells/uL   Basophils Absolute 62 0 - 200 cells/uL   Neutrophils Relative % 52.2 %   Total Lymphocyte 37.1 %   Monocytes Relative 5.7 %   Eosinophils Relative 4.3 %   Basophils Relative 0.7 %  Comprehensive Metabolic Panel (CMET)  Result Value Ref Range   Glucose, Bld 93 65 - 99 mg/dL   BUN 16 7 - 25 mg/dL   Creat 7.56 4.33 - 2.95 mg/dL   BUN/Creatinine Ratio NOT APPLICABLE 6 - 22 (calc)   Sodium 136 135 - 146 mmol/L   Potassium 4.2 3.5 - 5.3 mmol/L   Chloride 104 98 - 110 mmol/L  CO2 24 20 - 32 mmol/L   Calcium 9.3 8.6 - 10.4 mg/dL   Total Protein 6.4 6.1 - 8.1 g/dL   Albumin 4.0 3.6 - 5.1 g/dL   Globulin 2.4 1.9 - 3.7 g/dL (calc)   AG Ratio 1.7 1.0 - 2.5 (calc)    Total Bilirubin 0.3 0.2 - 1.2 mg/dL   Alkaline phosphatase (APISO) 47 37 - 153 U/L   AST 15 10 - 35 U/L   ALT 10 6 - 29 U/L  Lipid Profile  Result Value Ref Range   Cholesterol 224 (H) <200 mg/dL   HDL 73 > OR = 50 mg/dL   Triglycerides 48 <474 mg/dL   LDL Cholesterol (Calc) 136 (H) mg/dL (calc)   Total CHOL/HDL Ratio 3.1 <5.0 (calc)   Non-HDL Cholesterol (Calc) 151 (H) <130 mg/dL (calc)  Thyroid Panel With TSH  Result Value Ref Range   T3 Uptake 31 22 - 35 %   T4, Total 9.4 5.1 - 11.9 mcg/dL   Free Thyroxine Index 2.9 1.4 - 3.8   TSH 1.37 mIU/L  Urinalysis, Routine w reflex microscopic  Result Value Ref Range   Color, Urine YELLOW YELLOW   APPearance CLOUDY (A) CLEAR   Specific Gravity, Urine 1.019 1.001 - 1.03   pH 7.5 5.0 - 8.0   Glucose, UA NEGATIVE NEGATIVE   Bilirubin Urine NEGATIVE NEGATIVE   Ketones, ur NEGATIVE NEGATIVE   Hgb urine dipstick NEGATIVE NEGATIVE   Protein, ur NEGATIVE NEGATIVE   Nitrite POSITIVE (A) NEGATIVE   Leukocytes,Ua NEGATIVE NEGATIVE   WBC, UA NONE SEEN 0 - 5 /HPF   RBC / HPF NONE SEEN 0 - 2 /HPF   Squamous Epithelial / LPF 0-5 < OR = 5 /HPF   Bacteria, UA MANY (A) NONE SEEN /HPF   Hyaline Cast NONE SEEN NONE SEEN /LPF  Urinalysis, Routine w reflex microscopic  Result Value Ref Range   Color, Urine YELLOW YELLOW   APPearance CLEAR CLEAR   Specific Gravity, Urine 1.010 1.001 - 1.03   pH 6.5 5.0 - 8.0   Glucose, UA NEGATIVE NEGATIVE   Bilirubin Urine NEGATIVE NEGATIVE   Ketones, ur NEGATIVE NEGATIVE   Hgb urine dipstick NEGATIVE NEGATIVE   Protein, ur NEGATIVE NEGATIVE   Nitrite NEGATIVE NEGATIVE   Leukocytes,Ua NEGATIVE NEGATIVE  POCT Urinalysis Dipstick  Result Value Ref Range   Color, UA Yellow    Clarity, UA clear    Glucose, UA Negative Negative   Bilirubin, UA Negative    Ketones, UA Negative    Spec Grav, UA <=1.005 (A) 1.010 - 1.025   Blood, UA trace    pH, UA 5.0 5.0 - 8.0   Protein, UA Negative Negative    Urobilinogen, UA 0.2 0.2 or 1.0 E.U./dL   Nitrite, UA positive    Leukocytes, UA Negative Negative   Appearance     Odor        Assessment & Plan:   Problem List Items Addressed This Visit    None    Visit Diagnoses    Herpes zoster without complication    -  Primary   Relevant Medications   valACYclovir (VALTREX) 1000 MG tablet   gabapentin (NEURONTIN) 100 MG capsule   Intractable chronic migraine without aura and without status migrainosus       Relevant Medications   SUMAtriptan (IMITREX) 50 MG tablet   gabapentin (NEURONTIN) 100 MG capsule      #Shingles, acute flare  Clinically consistent with new acute shingles  herpes zoster outbreak onset 3-4 days ago, with moderate neuropathic pain. - Never received zoster vaccine - No evidence of complications, however given area of cranial nerve affected on scalp and face - it is affecting her R Eyebrow and R Eyelid, does NOT appear to affect her RIGHT eye but I am concerned that she should monitor this closely.  Plan: Fluorescein dye test performed today, see detail above, it was NEGATIVE for any zoster pattern of dye uptake in eye. It was normal and no focal dye collection within R eye. Which is reassuring. She may still go to her eye doctor at [facility] for follow-up  1. Start Valacyclovir 1000mg  TID with food x 7 days, counseled on treatment course and side effects 2. Discussed potential course of post-herpetic neuralgia symptoms - Will rx gabapentin trial for PRN use - titrate as needed. - Future consider TCA option if indicated. 3. Counseling on infectious nature of problem with avoiding close contact with high risk populations 4. Follow-up as needed within 4-6 weeks if not resolving or if residual pain, strict return criteria if facial or ocular involvement or other acute concerns  Future can get Shingrix vaccine >6 weeks after shingles has resolved.   #Migraines, chronic Chronic problem, currently with flare recently due to  stress with headaches now has shingles flare Will refill Sumatriptan imitrex for PRN use She should return to clinic in future when ready to discuss prophylaxis options and prevention with PCP.  Meds ordered this encounter  Medications  . valACYclovir (VALTREX) 1000 MG tablet    Sig: Take 1 tablet (1,000 mg total) by mouth 3 (three) times daily. For shingles    Dispense:  21 tablet    Refill:  0  . SUMAtriptan (IMITREX) 50 MG tablet    Sig: Take 1 tablet (50 mg total) by mouth once as needed for up to 1 dose for migraine. May repeat one dose in 2 hours if headache persists, for max dose 24 hours    Dispense:  12 tablet    Refill:  2  . gabapentin (NEURONTIN) 100 MG capsule    Sig: Start 1 capsule daily, increase by 1 cap every 2-3 days as tolerated up to 3 times a day, or may take 3 at once in evening.    Dispense:  90 capsule    Refill:  0      Follow up plan: Return in about 4 weeks (around 01/31/2020), or if symptoms worsen or fail to improve, for shingles, migraines.  Saralyn PilarAlexander Ema Hebner, DO [facility] 01/03/2020, 2:00 PM

## 2020-01-03 NOTE — Patient Instructions (Addendum)
Thank you for coming to the office today.  Start the anti-viral medication - Valtrex take one 3 times a day for 7 days to complete the entire course. This will help the current flare up heal, including the rash to dry up and resolve quicker. It may take several days to weeks for the rash to completely resolve. Typically it will dry up and scab off.  Blister and Rash Care  Take a cool bath or apply cool compresses to the area of the rash or blisters as directed by your health care provider. This may help with pain and itching.  Keep your rash covered with a loose bandage (dressing). Wear loose-fitting clothing to help ease the pain of material rubbing against the rash.  Keep your rash and blisters clean with mild soap and cool water or as directed by your health care provider.  Check your rash every day for signs of infection. These include redness, swelling, and pain that lasts or increases.  Do not pick your blisters.  Do not scratch your rash.  As mentioned, some patients experience "Post-Herpetic Neuralgia" or a persistent sensation or feeling of "nerve irritation or pain" that can last for days to weeks to months or longer in this same area.  You may try the nerve medicine to help ease these symptoms now or in the future if it is needed. Try Gabapentin 100mg  capsules, take at night for 2-3 nights only, and then increase to 2 times a day for a few days, and then may increase to 3 times a day, it may make you drowsy, if helps significantly at night only, then you can increase instead to 3 capsules at night, instead of 3 times a day - In the future if needed, we can significantly increase the dose if tolerated well, some common doses are 300mg  three times a day up to 600mg  three times a day, usually it takes several weeks or months to get to higher doses  It is contagious to patients who have never had Chicken Pox - or someone who is pregnant (unborn baby is at risk) - or elderly with  weakened immune system. Try to avoid direct close contact with others on the skin if you have active blisters. Once the rash dries up and heals, it is less of a concern.  You may get the Shingles vaccine (Shingrix) approximately 6 weeks after the rash and episode is resolved. It is a two dose series of vaccines.    Contact your doctor if:  Your pain is not relieved with prescribed medicines.  Your pain does not get better after the rash heals.  Your rash looks infected. Signs of infection include redness, swelling, and pain that lasts or increases.  Please seek more immediate medical attention at the [facility] Emergency Department IF  The rash is on your face or nose.  You have facial pain, pain around your eye area, or loss of feeling on one side of your face.  You have ear pain or you have ringing in your ear.  You have loss of taste.  Your condition gets worse.    ------------------------------------------------------------------------------------------------------- Please review the additional information below about Shingles, if you have additional concerns.  Shingles Shingles, which is also known as herpes zoster, is an infection that causes a painful skin rash and fluid-filled blisters. Shingles is not related to genital herpes, which is a sexually transmitted infection. Shingles only develops in people who:  Have had chickenpox.  Have received the chickenpox vaccine. (  This is rare.)  What are the causes? Shingles is caused by varicella-zoster virus (VZV). This is the same virus that causes chickenpox. After exposure to VZV, the virus stays in the body in an inactive (dormant) state. Shingles develops if the virus reactivates. This can happen many years after the initial exposure to VZV. It is not known what causes this virus to reactivate. What increases the risk? People who have had chickenpox or received the chickenpox vaccine are at risk for shingles. Infection is  more common in people who:  Are older than age 62.  Have a weakened defense (immune) system, such as those with HIV, AIDS, or cancer.  Are taking medicines that weaken the immune system, such as transplant medicines.  Are under great stress.  What are the signs or symptoms? Early symptoms of this condition include itching, tingling, and pain in an area on your skin. Pain may be described as burning, stabbing, or throbbing. A few days or weeks after symptoms start, a painful red rash appears, usually on one side of the body in a bandlike or beltlike pattern. The rash eventually turns into fluid-filled blisters that break open, scab over, and dry up in about 2-3 weeks. At any time during the infection, you may also develop:  A fever.  Chills.  A headache.  An upset stomach.  How is this diagnosed? This condition is diagnosed with a skin exam. Sometimes, skin or fluid samples are taken from the blisters before a diagnosis is made. These samples are examined under a microscope or sent to a lab for testing. How is this treated? There is no specific cure for this condition. Your health care provider will probably prescribe medicines to help you manage pain, recover more quickly, and avoid long-term problems. Medicines may include:  Antiviral drugs.  Anti-inflammatory drugs.  Pain medicines.  If the area involved is on your face, you may be referred to a specialist, such as an eye doctor (ophthalmologist) or an ear, nose, and throat (ENT) doctor to help you avoid eye problems, chronic pain, or disability. Follow these instructions at home: Medicines  Take medicines only as directed by your health care provider.  Apply an anti-itch or numbing cream to the affected area as directed by your health care provider. General instructions  Rest as directed by your health care provider.  Keep all follow-up visits as directed by your health care provider. This is important.  Until your  blisters scab over, your infection can cause chickenpox in people who have never had it or been vaccinated against it. To prevent this from happening, avoid contact with other people, especially: ? Babies. ? Pregnant women. ? Children who have eczema. ? Elderly people who have transplants. ? People who have chronic illnesses, such as leukemia or AIDS. This information is not intended to replace advice given to you by your health care provider. Make sure you discuss any questions you have with your health care provider. Document Released: 05/06/2005 Document Revised: 12/31/2015 Document Reviewed: 03/17/2014 Elsevier Interactive Patient Education  2018 [employer].      Please schedule a Follow-up Appointment to: Return in about 4 weeks (around 01/31/2020), or if symptoms worsen or fail to improve, for shingles, migraines.  If you have any other questions or concerns, please feel free to call the office or send a message through MyChart. You may also schedule an earlier appointment if necessary.  Additionally, you may be receiving a survey about your experience at our office within  a few days to 1 week by e-mail or mail. We value your feedback.  Nobie Putnam, DO [place]

## 2020-03-16 ENCOUNTER — Other Ambulatory Visit: Payer: Self-pay | Admitting: Family Medicine

## 2020-03-16 DIAGNOSIS — F5101 Primary insomnia: Secondary | ICD-10-CM

## 2020-03-17 ENCOUNTER — Other Ambulatory Visit: Payer: Self-pay

## 2020-03-17 ENCOUNTER — Ambulatory Visit (INDEPENDENT_AMBULATORY_CARE_PROVIDER_SITE_OTHER): Payer: Federal, State, Local not specified - PPO | Admitting: Family Medicine

## 2020-03-17 ENCOUNTER — Encounter: Payer: Self-pay | Admitting: Family Medicine

## 2020-03-17 VITALS — BP 113/74 | HR 66 | Temp 98.7°F | Resp 17 | Ht 64.0 in | Wt 159.6 lb

## 2020-03-17 DIAGNOSIS — Z1211 Encounter for screening for malignant neoplasm of colon: Secondary | ICD-10-CM | POA: Diagnosis not present

## 2020-03-17 DIAGNOSIS — F419 Anxiety disorder, unspecified: Secondary | ICD-10-CM | POA: Diagnosis not present

## 2020-03-17 DIAGNOSIS — F5101 Primary insomnia: Secondary | ICD-10-CM | POA: Diagnosis not present

## 2020-03-17 MED ORDER — TEMAZEPAM 15 MG PO CAPS: ORAL_CAPSULE | ORAL | 2 refills | Status: DC

## 2020-03-17 NOTE — Assessment & Plan Note (Signed)
Pt requiring colon cancer screening.  Denies family history of colon cancer.  Plan: - Discussed timing for initiation of colon cancer screening ACS vs USPSTF guidelines - Mutual decision making discussion for options of colonoscopy vs cologuard.  Pt prefers cologuard. - Ordered Cologuard today 

## 2020-03-17 NOTE — Assessment & Plan Note (Signed)
PHQ9-4/GAD7-7.  Reports is having a slight increase in anxiety with watching the news.  Has found if she avoids watching the news her anxiety is well managed.  Has been taking venlafaxine XR 150mg  daily.  To continue.  Refills sent.  Plan: 1. Continue venlafaxine XR 150mg  daily 2. Continue to work on non-pharm stress management 3. RTC in 6 months

## 2020-03-17 NOTE — Progress Notes (Signed)
Subjective:    Patient ID: Sharon Ponce, female    DOB: 03-21-1971, 49 y.o.   MRN: [id number]  Sharon Ponce is a 49 y.o. female presenting on 03/17/2020 for Anxiety  Ms. Goodroe presents to clinic for a follow up on her anxiety and medication refill for her insomnia.  Reports she has been having an increase in anxiety and has found it increases when she watches the news.  Has found when she turns the news off, she has better control of her anxiety.    Depression screen [facility] 2/9 03/17/2020 07/09/2019 03/08/2019  Decreased Interest 1 1 0  Down, Depressed, Hopeless 0 0 0  PHQ - 2 Score 1 1 0  Altered sleeping 1 3 2   Tired, decreased energy 1 1 0  Change in appetite 0 0 0  Feeling bad or failure about yourself  0 0 0  Trouble concentrating 1 0 0  Moving slowly or fidgety/restless 0 0 0  Suicidal thoughts 0 0 0  PHQ-9 Score 4 5 2   Difficult doing work/chores Somewhat difficult Extremely dIfficult Not difficult at all    Social History   Tobacco Use  . Smoking status: Current Every Day Smoker    Packs/day: 0.25    Years: 20.00    Pack years: 5.00    Types: Cigarettes  . Smokeless tobacco: Never Used  Vaping Use  . Vaping Use: Never used  Substance Use Topics  . Alcohol use: Never  . Drug use: Never    Review of Systems  Constitutional: Negative.   HENT: Negative.   Eyes: Negative.   Respiratory: Negative.   Cardiovascular: Negative.   Gastrointestinal: Negative.   Endocrine: Negative.   Genitourinary: Negative.   Musculoskeletal: Negative.   Skin: Negative.   Allergic/Immunologic: Negative.   Neurological: Negative.   Hematological: Negative.   Psychiatric/Behavioral: Positive for sleep disturbance. Negative for agitation, behavioral problems, confusion, decreased concentration, dysphoric mood, hallucinations, self-injury and suicidal ideas. The patient is nervous/anxious. The patient is not hyperactive.    Per HPI unless specifically indicated above     Objective:      BP 113/74 (BP Location: Right Arm, Patient Position: Sitting, Cuff Size: Normal)   Pulse 66   Temp 98.7 F (37.1 C) (Oral)   Resp 17   Ht 5\' 4"  (1.626 m)   Wt 159 lb 9.6 oz (72.4 kg)   SpO2 99%   BMI 27.40 kg/m   Wt Readings from Last 3 Encounters:  03/17/20 159 lb 9.6 oz (72.4 kg)  01/03/20 153 lb (69.4 kg)  07/09/19 157 lb (71.2 kg)    Physical Exam Vitals and nursing note reviewed.  Constitutional:      General: She is not in acute distress.    Appearance: Normal appearance. She is well-developed and well-groomed. She is not ill-appearing or toxic-appearing.  HENT:     Head: Normocephalic and atraumatic.     Nose:     Comments: 03/19/20 is in place, covering mouth and nose. Eyes:     General: Lids are normal. Vision grossly intact.        Right eye: No discharge.        Left eye: No discharge.     Extraocular Movements: Extraocular movements intact.     Conjunctiva/sclera: Conjunctivae normal.     Pupils: Pupils are equal, round, and reactive to light.  Cardiovascular:     Pulses: Normal pulses.  Pulmonary:     Effort: Pulmonary effort is normal. No respiratory distress.  Skin:    General: Skin is warm and dry.     Capillary Refill: Capillary refill takes less than 2 seconds.  Neurological:     General: No focal deficit present.     Mental Status: She is alert and oriented to person, place, and time.  Psychiatric:        Attention and Perception: Attention and perception normal.        Mood and Affect: Mood and affect normal.        Speech: Speech normal.        Behavior: Behavior normal. Behavior is cooperative.        Thought Content: Thought content normal.        Cognition and Memory: Cognition and memory normal.        Judgment: Judgment normal.    Results for orders placed or performed in visit on 07/09/19  Urine Culture   Specimen: Urine  Result Value Ref Range   MICRO NUMBER: [id number]    SPECIMEN QUALITY: Adequate    Sample Source URINE     STATUS: FINAL    ISOLATE 1: Escherichia coli (A)       Susceptibility   Escherichia coli - URINE CULTURE, REFLEX    AMOX/CLAVULANIC <=2 Sensitive     AMPICILLIN <=2 Sensitive     AMPICILLIN/SULBACTAM <=2 Sensitive     CEFAZOLIN* <=4 Not Reportable      * For infections other than uncomplicated UTIcaused by E. coli, K. pneumoniae or P. mirabilis:Cefazolin is resistant if MIC > or = 8 mcg/mL.(Distinguishing susceptible versus intermediatefor isolates with MIC < or = 4 mcg/mL requiresadditional testing.)For uncomplicated UTI caused by E. coli,K. pneumoniae or P. mirabilis: Cefazolin issusceptible if MIC <32 mcg/mL and predictssusceptible to the oral agents cefaclor, cefdinir,cefpodoxime, cefprozil, cefuroxime, cephalexinand loracarbef.    CEFEPIME <=1 Sensitive     CEFTRIAXONE <=1 Sensitive     CIPROFLOXACIN <=0.25 Sensitive     LEVOFLOXACIN <=0.12 Sensitive     ERTAPENEM <=0.5 Sensitive     GENTAMICIN <=1 Sensitive     IMIPENEM <=0.25 Sensitive     NITROFURANTOIN <=16 Sensitive     PIP/TAZO <=4 Sensitive     TOBRAMYCIN <=1 Sensitive     TRIMETH/SULFA* <=20 Sensitive      * For infections other than uncomplicated UTIcaused by E. coli, K. pneumoniae or P. mirabilis:Cefazolin is resistant if MIC > or = 8 mcg/mL.(Distinguishing susceptible versus intermediatefor isolates with MIC < or = 4 mcg/mL requiresadditional testing.)For uncomplicated UTI caused by E. coli,K. pneumoniae or P. mirabilis: Cefazolin issusceptible if MIC <32 mcg/mL and predictssusceptible to the oral agents cefaclor, cefdinir,cefpodoxime, cefprozil, cefuroxime, cephalexinand loracarbef.Legend:S = Susceptible  I = IntermediateR = Resistant  NS = Not susceptible* = Not tested  NR = Not reported**NN = See antimicrobic comments  Urine Culture  Result Value Ref Range   MICRO NUMBER: [id number]    SPECIMEN QUALITY: Adequate    Sample Source URINE    STATUS: FINAL    ISOLATE 1: Escherichia coli (A)       Susceptibility   Escherichia  coli - URINE CULTURE, REFLEX    AMOX/CLAVULANIC <=2 Sensitive     AMPICILLIN <=2 Sensitive     AMPICILLIN/SULBACTAM <=2 Sensitive     CEFAZOLIN* <=4 Not Reportable      * For infections other than uncomplicated UTIcaused by E. coli, K. pneumoniae or P. mirabilis:Cefazolin is resistant if MIC > or = 8 mcg/mL.(Distinguishing susceptible versus intermediatefor isolates with MIC < or = 4  mcg/mL requiresadditional testing.)For uncomplicated UTI caused by E. coli,K. pneumoniae or P. mirabilis: Cefazolin issusceptible if MIC <32 mcg/mL and predictssusceptible to the oral agents cefaclor, cefdinir,cefpodoxime, cefprozil, cefuroxime, cephalexinand loracarbef.    CEFEPIME <=1 Sensitive     CEFTRIAXONE <=1 Sensitive     CIPROFLOXACIN <=0.25 Sensitive     LEVOFLOXACIN <=0.12 Sensitive     ERTAPENEM <=0.5 Sensitive     GENTAMICIN <=1 Sensitive     IMIPENEM <=0.25 Sensitive     NITROFURANTOIN <=16 Sensitive     PIP/TAZO <=4 Sensitive     TOBRAMYCIN <=1 Sensitive     TRIMETH/SULFA* <=20 Sensitive      * For infections other than uncomplicated UTIcaused by E. coli, K. pneumoniae or P. mirabilis:Cefazolin is resistant if MIC > or = 8 mcg/mL.(Distinguishing susceptible versus intermediatefor isolates with MIC < or = 4 mcg/mL requiresadditional testing.)For uncomplicated UTI caused by E. coli,K. pneumoniae or P. mirabilis: Cefazolin issusceptible if MIC <32 mcg/mL and predictssusceptible to the oral agents cefaclor, cefdinir,cefpodoxime, cefprozil, cefuroxime, cephalexinand loracarbef.Legend:S = Susceptible  I = IntermediateR = Resistant  NS = Not susceptible* = Not tested  NR = Not reported**NN = See antimicrobic comments  CBC with Differential  Result Value Ref Range   WBC 8.8 3.8 - 10.8 Thousand/uL   RBC 4.21 3.80 - 5.10 Million/uL   Hemoglobin 12.9 11.7 - 15.5 g/dL   HCT 16.137.0 35 - 45 %   MCV 87.9 80.0 - 100.0 fL   MCH 30.6 27.0 - 33.0 pg   MCHC 34.9 32.0 - 36.0 g/dL   RDW 09.613.6 04.511.0 - 40.915.0 %   Platelets  269 140 - 400 Thousand/uL   MPV 10.8 7.5 - 12.5 fL   Neutro Abs 4,594 1,500 - 7,800 cells/uL   Lymphs Abs 3,265 850 - 3,900 cells/uL   Absolute Monocytes 502 200 - 950 cells/uL   Eosinophils Absolute 378 15.0 - 500.0 cells/uL   Basophils Absolute 62 0.0 - 200.0 cells/uL   Neutrophils Relative % 52.2 %   Total Lymphocyte 37.1 %   Monocytes Relative 5.7 %   Eosinophils Relative 4.3 %   Basophils Relative 0.7 %  Comprehensive Metabolic Panel (CMET)  Result Value Ref Range   Glucose, Bld 93 65 - 99 mg/dL   BUN 16 7 - 25 mg/dL   Creat 8.110.66 9.140.50 - 7.821.05 mg/dL   BUN/Creatinine Ratio NOT APPLICABLE 6 - 22 (calc)   Sodium 136 135 - 146 mmol/L   Potassium 4.2 3.5 - 5.3 mmol/L   Chloride 104 98 - 110 mmol/L   CO2 24 20 - 32 mmol/L   Calcium 9.3 8.6 - 10.4 mg/dL   Total Protein 6.4 6.1 - 8.1 g/dL   Albumin 4.0 3.6 - 5.1 g/dL   Globulin 2.4 1.9 - 3.7 g/dL (calc)   AG Ratio 1.7 1.0 - 2.5 (calc)   Total Bilirubin 0.3 0.2 - 1.2 mg/dL   Alkaline phosphatase (APISO) 47 37 - 153 U/L   AST 15 10 - 35 U/L   ALT 10 6 - 29 U/L  Lipid Profile  Result Value Ref Range   Cholesterol 224 (H) <200 mg/dL   HDL 73 > OR = 50 mg/dL   Triglycerides 48 <956<150 mg/dL   LDL Cholesterol (Calc) 136 (H) mg/dL (calc)   Total CHOL/HDL Ratio 3.1 <5.0 (calc)   Non-HDL Cholesterol (Calc) 151 (H) <130 mg/dL (calc)  Thyroid Panel With TSH  Result Value Ref Range   T3 Uptake 31 22 - 35 %  T4, Total 9.4 5.1 - 11.9 mcg/dL   Free Thyroxine Index 2.9 1.4 - 3.8   TSH 1.37 mIU/L  Urinalysis, Routine w reflex microscopic  Result Value Ref Range   Color, Urine YELLOW YELLOW   APPearance CLOUDY (A) CLEAR   Specific Gravity, Urine 1.019 1.001 - 1.03   pH 7.5 5.0 - 8.0   Glucose, UA NEGATIVE NEGATIVE   Bilirubin Urine NEGATIVE NEGATIVE   Ketones, ur NEGATIVE NEGATIVE   Hgb urine dipstick NEGATIVE NEGATIVE   Protein, ur NEGATIVE NEGATIVE   Nitrite POSITIVE (A) NEGATIVE   Leukocytes,Ua NEGATIVE NEGATIVE   WBC, UA NONE  SEEN 0 - 5 /HPF   RBC / HPF NONE SEEN 0 - 2 /HPF   Squamous Epithelial / LPF 0-5 < OR = 5 /HPF   Bacteria, UA MANY (A) NONE SEEN /HPF   Hyaline Cast NONE SEEN NONE SEEN /LPF  Urinalysis, Routine w reflex microscopic  Result Value Ref Range   Color, Urine YELLOW YELLOW   APPearance CLEAR CLEAR   Specific Gravity, Urine 1.010 1.001 - 1.03   pH 6.5 5.0 - 8.0   Glucose, UA NEGATIVE NEGATIVE   Bilirubin Urine NEGATIVE NEGATIVE   Ketones, ur NEGATIVE NEGATIVE   Hgb urine dipstick NEGATIVE NEGATIVE   Protein, ur NEGATIVE NEGATIVE   Nitrite NEGATIVE NEGATIVE   Leukocytes,Ua NEGATIVE NEGATIVE  POCT Urinalysis Dipstick  Result Value Ref Range   Color, UA Yellow    Clarity, UA clear    Glucose, UA Negative Negative   Bilirubin, UA Negative    Ketones, UA Negative    Spec Grav, UA <=1.005 (A) 1.010 - 1.025   Blood, UA trace    pH, UA 5.0 5.0 - 8.0   Protein, UA Negative Negative   Urobilinogen, UA 0.2 0.2 or 1.0 E.U./dL   Nitrite, UA positive    Leukocytes, UA Negative Negative   Appearance     Odor        Assessment & Plan:   Problem List Items Addressed This Visit      Other   Primary insomnia - Primary    Patient with chronic, stable insomnia.  Patient continues to take temazepam without any side effects and regularly takes this approx 5 nights per week.   Has treatment failure with: Lunesta, Ambien, Sonata, Doxepin.  Temazepam is only medication that has been effective without side effects or withdrawal symptoms.  PDMP reviewed and appropriate.  Last refill 02/14/2020.   Plan: 1. Continue temazepam 15mg  QHS PRN.  Can take 5-7 nights per week for insomnia 2. Continue working on sleep hygiene 3. Follow up in 6 months      Relevant Medications   temazepam (RESTORIL) 15 MG capsule   Anxiety    PHQ9-4/GAD7-7.  Reports is having a slight increase in anxiety with watching the news.  Has found if she avoids watching the news her anxiety is well managed.  Has been taking  venlafaxine XR 150mg  daily.  To continue.  Refills sent.  Plan: 1. Continue venlafaxine XR 150mg  daily 2. Continue to work on non-pharm stress management 3. RTC in 6 months      Colon cancer screening    Pt requiring colon cancer screening.  Denies family history of colon cancer.  Plan: - Discussed timing for initiation of colon cancer screening ACS vs USPSTF guidelines - Mutual decision making discussion for options of colonoscopy vs cologuard.  Pt prefers cologuard. - Ordered Cologuard today       Relevant  Orders   Cologuard      Meds ordered this encounter  Medications  . temazepam (RESTORIL) 15 MG capsule    Sig: TAKE 1 CAPSULE(15 MG) BY MOUTH AT BEDTIME AS NEEDED FOR SLEEP    Dispense:  30 capsule    Refill:  2    Follow up plan: Return in about 6 months (around 09/15/2020) for Anxiety f/u.   Charlaine Dalton, FNP Family Nurse Practitioner [facility] 03/17/2020, 12:04 PM

## 2020-03-17 NOTE — Telephone Encounter (Signed)
Requested medication (s) are due for refill today: Yes  Requested medication (s) are on the active medication list: Yes  Last refill:  12/17/19  Future visit scheduled: Yes  Notes to clinic:  See request.    Requested Prescriptions  Pending Prescriptions Disp Refills   temazepam (RESTORIL) 15 MG capsule [Pharmacy Med Name: TEMAZEPAM 15MG  CAPSULES] 30 capsule     Sig: TAKE 1 CAPSULE(15 MG) BY MOUTH AT BEDTIME AS NEEDED FOR SLEEP      Not Delegated - Psychiatry:  Anxiolytics/Hypnotics Failed - 03/16/2020  8:07 PM      Failed - This refill cannot be delegated      Failed - Urine Drug Screen completed in last 360 days.      Failed - Valid encounter within last 6 months    Recent Outpatient Visits           2 months ago Herpes zoster without complication   [facility] [place], [place], DO   8 months ago Routine general medical examination at health care facility   [facility], FNP   1 year ago Non-seasonal allergic rhinitis, unspecified trigger   [facility], NP   1 year ago Primary insomnia   [facility], NP   1 year ago Intractable chronic migraine without aura and without status migrainosus   [facility] [place], [place], DO       Future Appointments             In 3 months Malfi, [name], FNP [facility], [facility]

## 2020-03-17 NOTE — Patient Instructions (Signed)
Continue all medications as directed  I have put in an order for Cologuard.  They will mail this directly to your home.  There will be an instructional handout that will review how to use the box to provide your sample.  We will plan to see you back in 6 months for anxiety follow up visit  You will receive a survey after today's visit either digitally by e-mail or paper by USPS mail. Your experiences and feedback matter to [place].  Please respond so we know how we are doing as we provide care for you.  Call us with any questions/concerns/needs.  It is my goal to be available to you for your health concerns.  Thanks for choosing me to be a partner in your healthcare needs!  Charlaine Dalton, FNP-C Family Nurse Practitioner [facility] Phone: [phone]

## 2020-03-17 NOTE — Assessment & Plan Note (Addendum)
Patient with chronic, stable insomnia.  Patient continues to take temazepam without any side effects and regularly takes this approx 5 nights per week.   Has treatment failure with: Lunesta, Ambien, Sonata, Doxepin.  Temazepam is only medication that has been effective without side effects or withdrawal symptoms.  PDMP reviewed and appropriate.  Last refill 02/14/2020.   Plan: 1. Continue temazepam 15mg  QHS PRN.  Can take 5-7 nights per week for insomnia 2. Continue working on sleep hygiene 3. Follow up in 6 months

## 2020-03-29 ENCOUNTER — Ambulatory Visit: Payer: Self-pay | Admitting: *Deleted

## 2020-03-29 ENCOUNTER — Other Ambulatory Visit: Payer: Self-pay | Admitting: Family Medicine

## 2020-03-29 DIAGNOSIS — G43719 Chronic migraine without aura, intractable, without status migrainosus: Secondary | ICD-10-CM

## 2020-03-29 DIAGNOSIS — F419 Anxiety disorder, unspecified: Secondary | ICD-10-CM

## 2020-03-29 NOTE — Telephone Encounter (Signed)
Patient is calling to report she has new onset dizziness that started yesterday after she had hot flash.Patient reports she is able to walk normally, she is eating and hydrating, no palpitations. Appointment has been scheduled for evaluation.   Reason for Disposition  [1] MILD dizziness (e.g., walking normally) AND [2] has NOT been evaluated by physician for this  (Exception: dizziness caused by heat exposure, sudden standing, or poor fluid intake)  Answer Assessment - Initial Assessment Questions 1. DESCRIPTION: "Describe your dizziness."     Feels foggy headed 2. LIGHTHEADED: "Do you feel lightheaded?" (e.g., somewhat faint, woozy, weak upon standing)     lightheaded 3. VERTIGO: "Do you feel like either you or the room is spinning or tilting?" (i.e. vertigo)     yes 4. SEVERITY: "How bad is it?"  "Do you feel like you are going to faint?" "Can you stand and walk?"   - MILD: Feels slightly dizzy, but walking normally.   - MODERATE: Feels very unsteady when walking, but not falling; interferes with normal activities (e.g., school, work) .   - SEVERE: Unable to walk without falling, or requires assistance to walk without falling; feels like passing out now.      mild 5. ONSET:  "When did the dizziness begin?"     Last night 6. AGGRAVATING FACTORS: "Does anything make it worse?" (e.g., standing, change in head position)     Standing up-walking 7. HEART RATE: "Can you tell me your heart rate?" "How many beats in 15 seconds?"  (Note: not all patients can do this)       No changes noted 8. CAUSE: "What do you think is causing the dizziness?"     Not sure 9. RECURRENT SYMPTOM: "Have you had dizziness before?" If Yes, ask: "When was the last time?" "What happened that time?"     Not like this- panic attack in the past 10. OTHER SYMPTOMS: "Do you have any other symptoms?" (e.g., fever, chest pain, vomiting, diarrhea, bleeding)       no 11. PREGNANCY: "Is there any chance you are pregnant?"  "When was your last menstrual period?"       No- LMP-2 weeks ago  Protocols used: DIZZINESS Sharon Ponce

## 2020-03-30 ENCOUNTER — Ambulatory Visit: Payer: Federal, State, Local not specified - PPO | Admitting: Family Medicine

## 2020-03-30 ENCOUNTER — Ambulatory Visit: Payer: Self-pay | Admitting: Family Medicine

## 2020-05-16 ENCOUNTER — Other Ambulatory Visit: Payer: Self-pay

## 2020-05-16 ENCOUNTER — Telehealth (INDEPENDENT_AMBULATORY_CARE_PROVIDER_SITE_OTHER): Payer: Federal, State, Local not specified - PPO | Admitting: Family Medicine

## 2020-05-16 ENCOUNTER — Encounter: Payer: Self-pay | Admitting: Family Medicine

## 2020-05-16 DIAGNOSIS — F419 Anxiety disorder, unspecified: Secondary | ICD-10-CM

## 2020-05-16 DIAGNOSIS — G43719 Chronic migraine without aura, intractable, without status migrainosus: Secondary | ICD-10-CM | POA: Diagnosis not present

## 2020-05-16 DIAGNOSIS — F5101 Primary insomnia: Secondary | ICD-10-CM | POA: Diagnosis not present

## 2020-05-16 MED ORDER — VENLAFAXINE HCL ER 150 MG PO CP24: ORAL_CAPSULE | ORAL | 1 refills | Status: DC

## 2020-05-16 MED ORDER — TEMAZEPAM 15 MG PO CAPS: ORAL_CAPSULE | ORAL | 2 refills | Status: DC

## 2020-05-16 NOTE — Assessment & Plan Note (Signed)
Patient with chronic, stable insomnia.  Continues to take temazepam without any side effects and regularly takes this approximately 5 nights per week.    Has had treatment failure with [place] Prey, sonata, doxepin.  Temazepam is the only medication that has been effective without side effects or withdrawal symptoms.  PDMP reviewed and appropriate.  Plan: 1. Continue temazepam 15mg  QHS PRN.  Can take 5-7 nights per week for insomnia 2. Continue working on sleep hygiene 3. RTC in 6 months

## 2020-05-16 NOTE — Progress Notes (Signed)
Virtual Visit via Telephone  The purpose of this virtual visit is to provide medical care while limiting exposure to the novel coronavirus (COVID19) for both patient and office staff.  Consent was obtained for phone visit:  Yes.   Answered questions that patient had about telehealth interaction:  Yes.   I discussed the limitations, risks, security and privacy concerns of performing an evaluation and management service by telephone. I also discussed with the patient that there may be a patient responsible charge related to this service. The patient expressed understanding and agreed to proceed.  Patient is at home and is accessed via telephone Services are provided by Charlaine Dalton, FNP-C from [facility])  ---------------------------------------------------------------------- Chief Complaint  Patient presents with  . Anxiety    S: Reviewed CMA documentation. I have called patient and gathered additional HPI as follows:  Sharon Ponce presents for virtual telemedicine visit via telephone for medication refills for her venlafaxine and temazepam.  Reports she has been stable and well controlled with her anxiety and insomnia while on these medications.  Denies any acute concerns today.  Patient is currently home Denies any high risk travel to areas of current concern for COVID19. Denies any known or suspected exposure to person with or possibly with COVID19.  Past Medical History:  Diagnosis Date  . Allergy   . Anxiety    Social History   Tobacco Use  . Smoking status: Current Every Day Smoker    Packs/day: 0.25    Years: 20.00    Pack years: 5.00    Types: Cigarettes  . Smokeless tobacco: Never Used  Vaping Use  . Vaping Use: Never used  Substance Use Topics  . Alcohol use: Never  . Drug use: Never    Current Outpatient Medications:  .  fluticasone (FLONASE) 50 MCG/ACT nasal spray, SHAKE LQ AND U 2 SPRAYS IEN QD, Disp: 15.8 mL, Rfl: 3 .  Multiple  Vitamin (MULTIVITAMIN) tablet, Take 1 tablet by mouth daily., Disp: , Rfl:  .  Omega-3 Fatty Acids (OMEGA-3 FISH OIL PO), Take by mouth., Disp: , Rfl:  .  Potassium (POTASSIMIN PO), Take by mouth., Disp: , Rfl:  .  PREVIDENT 5000 SENSITIVE 1.1-5 % PSTE, USE EVERY PM AFTER BRUSHING AND FLOSSING. SPIT OUT EXCESS. NO RINSE/EAT/DRINK FOR AT LEAST 30 MINUTES AFTER APPLICATION, Disp: , Rfl:  .  SUMAtriptan (IMITREX) 50 MG tablet, Take 1 tablet (50 mg total) by mouth once as needed for up to 1 dose for migraine. May repeat one dose in 2 hours if headache persists, for max dose 24 hours, Disp: 12 tablet, Rfl: 2 .  Turmeric (CURCUMIN 95 PO), Take by mouth., Disp: , Rfl:  .  vitamin C (ASCORBIC ACID) 500 MG tablet, Take 500 mg by mouth daily., Disp: , Rfl:  .  temazepam (RESTORIL) 15 MG capsule, TAKE 1 CAPSULE(15 MG) BY MOUTH AT BEDTIME AS NEEDED FOR SLEEP, Disp: 30 capsule, Rfl: 2 .  venlafaxine XR (EFFEXOR-XR) 150 MG 24 hr capsule, TAKE 1 CAPSULE(150 MG) BY MOUTH DAILY WITH BREAKFAST, Disp: 90 capsule, Rfl: 1  Depression screen [facility] 2/9 03/17/2020 07/09/2019 03/08/2019  Decreased Interest 1 1 0  Down, Depressed, Hopeless 0 0 0  PHQ - 2 Score 1 1 0  Altered sleeping 1 3 2   Tired, decreased energy 1 1 0  Change in appetite 0 0 0  Feeling bad or failure about yourself  0 0 0  Trouble concentrating 1 0 0  Moving slowly  or fidgety/restless 0 0 0  Suicidal thoughts 0 0 0  PHQ-9 Score 4 5 2   Difficult doing work/chores Somewhat difficult Extremely dIfficult Not difficult at all    GAD 7 : Generalized Anxiety Score 03/17/2020 07/09/2019 03/08/2019 12/14/2018  Nervous, Anxious, on Edge 1 1 1 2   Control/stop worrying 1 0 0 1  Worry too much - different things 1 1 1 1   Trouble relaxing 1 1 1 2   Restless 1 2 0 3  Easily annoyed or irritable 1 0 1 2  Afraid - awful might happen 1 0 0 1  Total GAD 7 Score 7 5 4 12   Anxiety Difficulty Somewhat difficult Not difficult at all Not difficult at all Not difficult  at all    -------------------------------------------------------------------------- O: No physical exam performed due to remote telephone encounter.  Physical Exam: Patient remotely monitored without video.  Verbal communication appropriate.  Cognition normal.  No results found for this or any previous visit (from the past 2160 hour(s)).  -------------------------------------------------------------------------- A&P:  Problem List Items Addressed This Visit      Other   Primary insomnia    Patient with chronic, stable insomnia.  Continues to take temazepam without any side effects and regularly takes this approximately 5 nights per week.    Has had treatment failure with , sonata, doxepin.  Temazepam is the only medication that has been effective without side effects or withdrawal symptoms.  PDMP reviewed and appropriate.  Plan: 1. Continue temazepam 15mg  QHS PRN.  Can take 5-7 nights per week for insomnia 2. Continue working on sleep hygiene 3. RTC in 6 months      Relevant Medications   temazepam (RESTORIL) 15 MG capsule   Anxiety - Primary    Currently stable and well controlled with venlafaxine XR 150mg  daily.  To continue.  Refills sent to pharmacy on file.  Plan: 1. Continue venlafaxine XR 150mg  daily 2. Continue to work on 3. RTC in 6 months      Relevant Medications   venlafaxine XR (EFFEXOR-XR) 150 MG 24 hr capsule    Other Visit Diagnoses    Intractable chronic migraine without aura and without status migrainosus       Relevant Medications   venlafaxine XR (EFFEXOR-XR) 150 MG 24 hr capsule      Meds ordered this encounter  Medications  . venlafaxine XR (EFFEXOR-XR) 150 MG 24 hr capsule    Sig: TAKE 1 CAPSULE(150 MG) BY MOUTH DAILY WITH BREAKFAST    Dispense:  90 capsule    Refill:  1  . temazepam (RESTORIL) 15 MG capsule    Sig: TAKE 1 CAPSULE(15 MG) BY MOUTH AT BEDTIME AS NEEDED FOR SLEEP     Dispense:  30 capsule    Refill:  2    Follow-up: - Return in 6 months for anxiety and insomnia follow up visit  Patient verbalizes understanding with the above medical recommendations including the limitation of remote medical advice.  Specific follow-up and call-back criteria were given for patient to follow-up or seek medical care more urgently if needed.  - Time spent in direct consultation with patient on phone: 5 minutes  , FNP-C [facility] 05/16/2020, 9:35 AM

## 2020-05-16 NOTE — Assessment & Plan Note (Signed)
Currently stable and well controlled with venlafaxine XR 150mg  daily.  To continue.  Refills sent to pharmacy on file.  Plan: 1. Continue venlafaxine XR 150mg  daily 2. Continue to work on 3. RTC in 6 months

## 2020-07-10 ENCOUNTER — Encounter: Payer: Federal, State, Local not specified - PPO | Admitting: Family Medicine

## 2020-08-28 ENCOUNTER — Other Ambulatory Visit: Payer: Self-pay

## 2020-08-28 ENCOUNTER — Ambulatory Visit: Payer: Federal, State, Local not specified - PPO | Admitting: Family Medicine

## 2020-08-28 ENCOUNTER — Encounter: Payer: Self-pay | Admitting: Family Medicine

## 2020-08-28 VITALS — BP 110/64 | HR 69 | Ht 64.0 in | Wt 158.2 lb

## 2020-08-28 DIAGNOSIS — N939 Abnormal uterine and vaginal bleeding, unspecified: Secondary | ICD-10-CM | POA: Diagnosis not present

## 2020-08-28 DIAGNOSIS — F5101 Primary insomnia: Secondary | ICD-10-CM | POA: Diagnosis not present

## 2020-08-28 DIAGNOSIS — G43719 Chronic migraine without aura, intractable, without status migrainosus: Secondary | ICD-10-CM

## 2020-08-28 MED ORDER — MEDROXYPROGESTERONE ACETATE 10 MG PO TABS: 10.0000 mg | ORAL_TABLET | Freq: Every day | ORAL | 0 refills | Status: DC

## 2020-08-28 MED ORDER — TEMAZEPAM 15 MG PO CAPS: ORAL_CAPSULE | ORAL | 2 refills | Status: DC

## 2020-08-28 NOTE — Progress Notes (Signed)
Subjective:    Patient ID: Sharon Ponce, female    DOB: Oct 04, 1970, 50 y.o.   MRN: [id number]  Sharon Ponce is a 50 y.o. female presenting on 08/28/2020 for Migraine and Menstrual Problem  Previous PCP Danielle Rankin, FNP  HPI   Abnormal Menstrual Cycle Perimenopausal symptoms - reports a full bleeding menstrual cycle every other week, seems to be significant amount of bleeding.  Migraines Chronic problem, previously discussed with me in 09/2018 and more recently in 12/2019, see prior note for further details She has had botox injections cosmetically, she says not having migraine that extends into these regions. She has had good results with infrequent migraines. - Takes Imitrex PRN, Venlafaxine preventative. No headache now. None in past 2+ months  Insomnia, primary Chronic problem, needs refill on Temazepam nightly.   Depression screen [facility] 2/9 03/17/2020 07/09/2019 03/08/2019  Decreased Interest 1 1 0  Down, Depressed, Hopeless 0 0 0  PHQ - 2 Score 1 1 0  Altered sleeping 1 3 2   Tired, decreased energy 1 1 0  Change in appetite 0 0 0  Feeling bad or failure about yourself  0 0 0  Trouble concentrating 1 0 0  Moving slowly or fidgety/restless 0 0 0  Suicidal thoughts 0 0 0  PHQ-9 Score 4 5 2   Difficult doing work/chores Somewhat difficult Extremely dIfficult Not difficult at all    Social History   Tobacco Use  . Smoking status: Current Some Day Smoker    Packs/day: 0.25    Years: 20.00    Pack years: 5.00    Types: Cigarettes  . Smokeless tobacco: Never Used  Vaping Use  . Vaping Use: Never used  Substance Use Topics  . Alcohol use: Never  . Drug use: Never    Review of Systems Per HPI unless specifically indicated above     Objective:    BP 110/64   Pulse 69   Ht 5\' 4"  (1.626 m)   Wt 158 lb 3.2 oz (71.8 kg)   SpO2 99%   BMI 27.15 kg/m   Wt Readings from Last 3 Encounters:  08/28/20 158 lb 3.2 oz (71.8 kg)  03/17/20 159 lb 9.6 oz (72.4 kg)  01/03/20  153 lb (69.4 kg)    Physical Exam Vitals and nursing note reviewed.  Constitutional:      General: She is not in acute distress.    Appearance: She is well-developed. She is not diaphoretic.     Comments: Well-appearing, comfortable, cooperative  HENT:     Head: Normocephalic and atraumatic.  Eyes:     General:        Right eye: No discharge.        Left eye: No discharge.     Conjunctiva/sclera: Conjunctivae normal.  Cardiovascular:     Rate and Rhythm: Normal rate.  Pulmonary:     Effort: Pulmonary effort is normal.  Skin:    General: Skin is warm and dry.     Findings: No erythema or rash.  Neurological:     Mental Status: She is alert and oriented to person, place, and time.  Psychiatric:        Behavior: Behavior normal.     Comments: Well groomed, good eye contact, normal speech and thoughts    Results for orders placed or performed in visit on 07/09/19  Urine Culture   Specimen: Urine  Result Value Ref Range   MICRO NUMBER: 03/19/20    SPECIMEN QUALITY: Adequate  Sample Source URINE    STATUS: FINAL    ISOLATE 1: Escherichia coli (A)       Susceptibility   Escherichia coli - URINE CULTURE, REFLEX    AMOX/CLAVULANIC <=2 Sensitive     AMPICILLIN <=2 Sensitive     AMPICILLIN/SULBACTAM <=2 Sensitive     CEFAZOLIN* <=4 Not Reportable      * For infections other than uncomplicated UTIcaused by E. coli, K. pneumoniae or P. mirabilis:Cefazolin is resistant if MIC > or = 8 mcg/mL.(Distinguishing susceptible versus intermediatefor isolates with MIC < or = 4 mcg/mL requiresadditional testing.)For uncomplicated UTI caused by E. coli,K. pneumoniae or P. mirabilis: Cefazolin issusceptible if MIC <32 mcg/mL and predictssusceptible to the oral agents cefaclor, cefdinir,cefpodoxime, cefprozil, cefuroxime, cephalexinand loracarbef.    CEFEPIME <=1 Sensitive     CEFTRIAXONE <=1 Sensitive     CIPROFLOXACIN <=0.25 Sensitive     LEVOFLOXACIN <=0.12 Sensitive     ERTAPENEM <=0.5  Sensitive     GENTAMICIN <=1 Sensitive     IMIPENEM <=0.25 Sensitive     NITROFURANTOIN <=16 Sensitive     PIP/TAZO <=4 Sensitive     TOBRAMYCIN <=1 Sensitive     TRIMETH/SULFA* <=20 Sensitive      * For infections other than uncomplicated UTIcaused by E. coli, K. pneumoniae or P. mirabilis:Cefazolin is resistant if MIC > or = 8 mcg/mL.(Distinguishing susceptible versus intermediatefor isolates with MIC < or = 4 mcg/mL requiresadditional testing.)For uncomplicated UTI caused by E. coli,K. pneumoniae or P. mirabilis: Cefazolin issusceptible if MIC <32 mcg/mL and predictssusceptible to the oral agents cefaclor, cefdinir,cefpodoxime, cefprozil, cefuroxime, cephalexinand loracarbef.Legend:S = Susceptible  I = IntermediateR = Resistant  NS = Not susceptible* = Not tested  NR = Not reported**NN = See antimicrobic comments  Urine Culture  Result Value Ref Range   MICRO NUMBER: [id number]    SPECIMEN QUALITY: Adequate    Sample Source URINE    STATUS: FINAL    ISOLATE 1: Escherichia coli (A)       Susceptibility   Escherichia coli - URINE CULTURE, REFLEX    AMOX/CLAVULANIC <=2 Sensitive     AMPICILLIN <=2 Sensitive     AMPICILLIN/SULBACTAM <=2 Sensitive     CEFAZOLIN* <=4 Not Reportable      * For infections other than uncomplicated UTIcaused by E. coli, K. pneumoniae or P. mirabilis:Cefazolin is resistant if MIC > or = 8 mcg/mL.(Distinguishing susceptible versus intermediatefor isolates with MIC < or = 4 mcg/mL requiresadditional testing.)For uncomplicated UTI caused by E. coli,K. pneumoniae or P. mirabilis: Cefazolin issusceptible if MIC <32 mcg/mL and predictssusceptible to the oral agents cefaclor, cefdinir,cefpodoxime, cefprozil, cefuroxime, cephalexinand loracarbef.    CEFEPIME <=1 Sensitive     CEFTRIAXONE <=1 Sensitive     CIPROFLOXACIN <=0.25 Sensitive     LEVOFLOXACIN <=0.12 Sensitive     ERTAPENEM <=0.5 Sensitive     GENTAMICIN <=1 Sensitive     IMIPENEM <=0.25 Sensitive      NITROFURANTOIN <=16 Sensitive     PIP/TAZO <=4 Sensitive     TOBRAMYCIN <=1 Sensitive     TRIMETH/SULFA* <=20 Sensitive      * For infections other than uncomplicated UTIcaused by E. coli, K. pneumoniae or P. mirabilis:Cefazolin is resistant if MIC > or = 8 mcg/mL.(Distinguishing susceptible versus intermediatefor isolates with MIC < or = 4 mcg/mL requiresadditional testing.)For uncomplicated UTI caused by E. coli,K. pneumoniae or P. mirabilis: Cefazolin issusceptible if MIC <32 mcg/mL and predictssusceptible to the oral agents cefaclor, cefdinir,cefpodoxime, cefprozil, cefuroxime, cephalexinand loracarbef.Legend:S =  Susceptible  I = IntermediateR = Resistant  NS = Not susceptible* = Not tested  NR = Not reported**NN = See antimicrobic comments  CBC with Differential  Result Value Ref Range   WBC 8.8 3.8 - 10.8 Thousand/uL   RBC 4.21 3.80 - 5.10 Million/uL   Hemoglobin 12.9 11.7 - 15.5 g/dL   HCT 10.2 58.5 - 27.7 %   MCV 87.9 80.0 - 100.0 fL   MCH 30.6 27.0 - 33.0 pg   MCHC 34.9 32.0 - 36.0 g/dL   RDW 82.4 23.5 - 36.1 %   Platelets 269 140 - 400 Thousand/uL   MPV 10.8 7.5 - 12.5 fL   Neutro Abs 4,594 1,500 - 7,800 cells/uL   Lymphs Abs 3,265 850 - 3,900 cells/uL   Absolute Monocytes 502 200 - 950 cells/uL   Eosinophils Absolute 378 15 - 500 cells/uL   Basophils Absolute 62 0 - 200 cells/uL   Neutrophils Relative % 52.2 %   Total Lymphocyte 37.1 %   Monocytes Relative 5.7 %   Eosinophils Relative 4.3 %   Basophils Relative 0.7 %  Comprehensive Metabolic Panel (CMET)  Result Value Ref Range   Glucose, Bld 93 65 - 99 mg/dL   BUN 16 7 - 25 mg/dL   Creat 4.43 1.54 - 0.08 mg/dL   BUN/Creatinine Ratio NOT APPLICABLE 6 - 22 (calc)   Sodium 136 135 - 146 mmol/L   Potassium 4.2 3.5 - 5.3 mmol/L   Chloride 104 98 - 110 mmol/L   CO2 24 20 - 32 mmol/L   Calcium 9.3 8.6 - 10.4 mg/dL   Total Protein 6.4 6.1 - 8.1 g/dL   Albumin 4.0 3.6 - 5.1 g/dL   Globulin 2.4 1.9 - 3.7 g/dL (calc)   AG  Ratio 1.7 1.0 - 2.5 (calc)   Total Bilirubin 0.3 0.2 - 1.2 mg/dL   Alkaline phosphatase (APISO) 47 37 - 153 U/L   AST 15 10 - 35 U/L   ALT 10 6 - 29 U/L  Lipid Profile  Result Value Ref Range   Cholesterol 224 (H) <200 mg/dL   HDL 73 > OR = 50 mg/dL   Triglycerides 48 <676 mg/dL   LDL Cholesterol (Calc) 136 (H) mg/dL (calc)   Total CHOL/HDL Ratio 3.1 <5.0 (calc)   Non-HDL Cholesterol (Calc) 151 (H) <130 mg/dL (calc)  Thyroid Panel With TSH  Result Value Ref Range   T3 Uptake 31 22 - 35 %   T4, Total 9.4 5.1 - 11.9 mcg/dL   Free Thyroxine Index 2.9 1.4 - 3.8   TSH 1.37 mIU/L  Urinalysis, Routine w reflex microscopic  Result Value Ref Range   Color, Urine YELLOW YELLOW   APPearance CLOUDY (A) CLEAR   Specific Gravity, Urine 1.019 1.001 - 1.03   pH 7.5 5.0 - 8.0   Glucose, UA NEGATIVE NEGATIVE   Bilirubin Urine NEGATIVE NEGATIVE   Ketones, ur NEGATIVE NEGATIVE   Hgb urine dipstick NEGATIVE NEGATIVE   Protein, ur NEGATIVE NEGATIVE   Nitrite POSITIVE (A) NEGATIVE   Leukocytes,Ua NEGATIVE NEGATIVE   WBC, UA NONE SEEN 0 - 5 /HPF   RBC / HPF NONE SEEN 0 - 2 /HPF   Squamous Epithelial / LPF 0-5 < OR = 5 /HPF   Bacteria, UA MANY (A) NONE SEEN /HPF   Hyaline Cast NONE SEEN NONE SEEN /LPF  Urinalysis, Routine w reflex microscopic  Result Value Ref Range   Color, Urine YELLOW YELLOW   APPearance CLEAR CLEAR  Specific Gravity, Urine 1.010 1.001 - 1.03   pH 6.5 5.0 - 8.0   Glucose, UA NEGATIVE NEGATIVE   Bilirubin Urine NEGATIVE NEGATIVE   Ketones, ur NEGATIVE NEGATIVE   Hgb urine dipstick NEGATIVE NEGATIVE   Protein, ur NEGATIVE NEGATIVE   Nitrite NEGATIVE NEGATIVE   Leukocytes,Ua NEGATIVE NEGATIVE  POCT Urinalysis Dipstick  Result Value Ref Range   Color, UA Yellow    Clarity, UA clear    Glucose, UA Negative Negative   Bilirubin, UA Negative    Ketones, UA Negative    Spec Grav, UA <=1.005 (A) 1.010 - 1.025   Blood, UA trace    pH, UA 5.0 5.0 - 8.0   Protein, UA  Negative Negative   Urobilinogen, UA 0.2 0.2 or 1.0 E.U./dL   Nitrite, UA positive    Leukocytes, UA Negative Negative   Appearance     Odor        Assessment & Plan:   Problem List Items Addressed This Visit    Primary insomnia   Relevant Medications   temazepam (RESTORIL) 15 MG capsule   Intractable chronic migraine without aura and without status migrainosus   Relevant Orders   Ambulatory referral to Neurology   Abnormal uterine bleeding (AUB) - Primary   Relevant Medications   medroxyPROGESTERone (PROVERA) 10 MG tablet      #Migraines, chronic Partially controlled on SNRI Trial on other meds in past On Imitrex PRN Infrequent migraines recently w/ cosmetic botox injections in face, interested to pursue botox injections for migraine specifically. Referral to [facility] Neurology Dr Lucia Gaskins to discuss migraines overall and consider botox option as per patient request  #AUB Perimenopausal Recent worsening inc bleeding every other week, has not had menopause yet. Still abnormal cycles. Trial on Provera 10mg  daily x 10 days for resetting cycle to slow down bleeding Follow-up if not improved we can refer to GYN next option, will likely warrant imaging ultrasound and other management.  #Insomnia Check PDMP Refill Temazepam.  Orders Placed This Encounter  Procedures  . Ambulatory referral to Neurology    Referral Priority:   Routine    Referral Type:   Consultation    Referral Reason:   Specialty Services Required    Requested Specialty:   Neurology    Number of Visits Requested:   1     Meds ordered this encounter  Medications  . medroxyPROGESTERone (PROVERA) 10 MG tablet    Sig: Take 1 tablet (10 mg total) by mouth daily. For 10 days, take before menstrual cycle for abnormal bleeding.    Dispense:  10 tablet    Refill:  0  . temazepam (RESTORIL) 15 MG capsule    Sig: TAKE 1 CAPSULE(15 MG) BY MOUTH AT BEDTIME AS NEEDED FOR SLEEP    Dispense:  30 capsule    Refill:  2      Follow up plan: Return in about 3 months (around 11/27/2020) for 3 month w/ 01/28/2021 in AM for Annual Physical, f/u migraines.   Rene Kocher, DO [facility] 08/28/2020, 8:34 AM

## 2020-08-28 NOTE — Patient Instructions (Addendum)
Thank you for coming to the office today.   [facility]   Address: [address], [place], [place] [address] Hours: 8AM-5PM Phone: [phone]  Referral sent - to Dr Lucia Gaskins. Stay tuned.  ---  Trial on Provera for 10 day course to improve bleeding pattern  If not working we can refer to GYN.    Please schedule a Follow-up Appointment to: Return in about 3 months (around 11/27/2020) for 3 month w/ Sharon Ponce in AM for Annual Physical, f/u migraines.  If you have any other questions or concerns, please feel free to call the office or send a message through MyChart. You may also schedule an earlier appointment if necessary.  Additionally, you may be receiving a survey about your experience at our office within a few days to 1 week by e-mail or mail. We value your feedback.  Sharon Pilar, DO [facility] [place]

## 2020-09-07 DIAGNOSIS — N939 Abnormal uterine and vaginal bleeding, unspecified: Secondary | ICD-10-CM

## 2020-09-13 ENCOUNTER — Other Ambulatory Visit: Payer: Self-pay

## 2020-09-13 ENCOUNTER — Other Ambulatory Visit (HOSPITAL_COMMUNITY)
Admission: RE | Admit: 2020-09-13 | Discharge: 2020-09-13 | Disposition: A | Discharge status: Home / Self Care | Payer: Federal, State, Local not specified - PPO | Source: Ambulatory Visit | Attending: Obstetrics and Gynecology | Admitting: Obstetrics and Gynecology

## 2020-09-13 ENCOUNTER — Encounter: Payer: Self-pay | Admitting: Obstetrics and Gynecology

## 2020-09-13 ENCOUNTER — Ambulatory Visit (INDEPENDENT_AMBULATORY_CARE_PROVIDER_SITE_OTHER): Payer: Federal, State, Local not specified - PPO | Admitting: Obstetrics and Gynecology

## 2020-09-13 VITALS — BP 118/74 | HR 81 | Resp 18 | Ht 64.0 in | Wt 157.0 lb

## 2020-09-13 DIAGNOSIS — N921 Excessive and frequent menstruation with irregular cycle: Secondary | ICD-10-CM | POA: Diagnosis not present

## 2020-09-13 NOTE — Progress Notes (Signed)
Obstetrics & Gynecology Office Visit   Chief Complaint  Patient presents with  . Gynecologic Exam  . abnormal uterine bleeding   The patient is seen in referral at the request of Saralyn Pilar * from [facility] for irregular bleeding.   History of Present Illness: 50 y.o. G0 female who is seen in referral from Saralyn Pilar * from [facility] for irregular bleeding.    She is having her period every other week.  She is a type A person.  She has had this for about a few months.  She has noticed over the past few years that things have become a little more abnormal with her periods in length.  However, things were more or less normal.  She has always had normal pap smears.  She feels like she is bloating during menstruation.  She has not been able to lose weight.  She has no new constipation.  With her bleeding she is not passing clots.  She changes pads every 3-4 hours during the day.  She states that she changes them frequently due to the smell.  She really has no significant gynecologic history, otherwise. She denies early satiety.   Last pap smear: 03/2018: NILM, HPV negative   Past Medical History:  Diagnosis Date  . Allergy   . Anxiety   . Migraine     Past Surgical History:  Procedure Laterality Date  . BUNIONECTOMY    . PLACEMENT OF BREAST IMPLANTS      Gynecologic History: Patient's last menstrual period was 09/10/2020.  Obstetric History: G0  Family History  Problem Relation Age of Onset  . Healthy Mother   . Healthy Father   . Leukemia Maternal Grandmother   . Heart disease Maternal Grandfather   . Vision loss Paternal Aunt   . Breast cancer Neg Hx   . Colon cancer Neg Hx   . Stroke Neg Hx   . Ovarian cancer Neg Hx     Social History   Socioeconomic History  . Marital status: Single    Spouse name: Not on file  . Number of children: Not on file  . Years of education: Not on file  . Highest education  level: Master's degree (e.g., MA, MS, MEng, MEd, MSW, MBA)  Occupational History  . Occupation: Agricultural engineer: [employer]  Tobacco Use  . Smoking status: Current Some Day Smoker    Packs/day: 0.25    Years: 20.00    Pack years: 5.00    Types: Cigarettes  . Smokeless tobacco: Never Used  . Tobacco comment: some days  Vaping Use  . Vaping Use: Never used  Substance and Sexual Activity  . Alcohol use: Never  . Drug use: Never  . Sexual activity: Not Currently    Birth control/protection: None  Other Topics Concern  . Not on file  Social History Narrative  . Not on file   Social Determinants of Health   Financial Resource Strain: Not on file  Food Insecurity: Not on file  Transportation Needs: Not on file  Physical Activity: Not on file  Stress: Not on file  Social Connections: Not on file  Intimate Partner Violence: Not on file    Allergies  Allergen Reactions  . Codeine Nausea And Vomiting  . Dust Mite Extract   . Mold Extract [Trichophyton]   . Tree Extract     Prior to Admission medications   Medication Sig Start Date End Date Taking? Authorizing Provider  fluticasone (FLONASE) 50 MCG/ACT nasal spray SHAKE LQ AND U 2 SPRAYS IEN QD 07/09/19  Yes Malfi, Jodelle Gross, FNP  Multiple Vitamin (MULTIVITAMIN) tablet Take 1 tablet by mouth daily.   Yes [provider]  Omega-3 Fatty Acids (OMEGA-3 FISH OIL PO) Take by mouth.   Yes [provider]  Potassium (POTASSIMIN PO) Take by mouth.   Yes [provider]  SUMAtriptan (IMITREX) 50 MG tablet Take 1 tablet (50 mg total) by mouth once as needed for up to 1 dose for migraine. May repeat one dose in 2 hours if headache persists, for max dose 24 hours 01/03/20  Yes Karamalegos, Alexander J, DO  temazepam (RESTORIL) 15 MG capsule TAKE 1 CAPSULE(15 MG) BY MOUTH AT BEDTIME AS NEEDED FOR SLEEP 08/28/20  Yes Karamalegos, [name], DO  Turmeric (CURCUMIN 95 PO) Take by mouth.   Yes [provider]   venlafaxine XR (EFFEXOR-XR) 150 MG 24 hr capsule TAKE 1 CAPSULE(150 MG) BY MOUTH DAILY WITH BREAKFAST 05/16/20  Yes Malfi, Jodelle Gross, FNP  vitamin C (ASCORBIC ACID) 500 MG tablet Take 500 mg by mouth daily.   Yes [provider]  PREVIDENT 5000 SENSITIVE 1.1-5 % PSTE USE EVERY PM AFTER BRUSHING AND FLOSSING. SPIT OUT EXCESS. NO RINSE/EAT/DRINK FOR AT LEAST 30 MINUTES AFTER APPLICATION 06/21/19   [provider]    Review of Systems  Constitutional: Negative.   HENT: Negative.   Eyes: Negative.   Respiratory: Negative.   Cardiovascular: Negative.   Gastrointestinal: Negative.   Genitourinary: Negative.   Musculoskeletal: Negative.   Skin: Negative.   Neurological: Negative.   Psychiatric/Behavioral: Negative.      Physical Exam BP 118/74   Pulse 81   Resp 18   Ht 5\' 4"  (1.626 m)   Wt 157 lb (71.2 kg)   LMP 09/10/2020   SpO2 98%   BMI 26.95 kg/m  Patient's last menstrual period was 09/10/2020. Physical Exam Constitutional:      General: She is not in acute distress.    Appearance: Normal appearance. She is well-developed.  Genitourinary:     Vulva, bladder and urethral meatus normal.     Right Labia: No rash, tenderness, lesions, skin changes or Bartholin's cyst.    Left Labia: No tenderness, skin changes, Bartholin's cyst or rash.    No inguinal adenopathy present in the right or left side.    Pelvic Tanner Score: 5/5.     Right Adnexa: not tender, not full and no mass present.    Left Adnexa: not tender, not full and no mass present.    No cervical motion tenderness, friability, lesion or polyp.     Uterus is not enlarged, fixed or tender.     Uterus is anteverted.     No urethral tenderness or mass present.     Pelvic exam was performed with patient in the lithotomy position.  HENT:     Head: Normocephalic and atraumatic.  Eyes:     General: No scleral icterus.    Conjunctiva/sclera: Conjunctivae normal.  Cardiovascular:     Rate and Rhythm:  Normal rate and regular rhythm.     Heart sounds: No murmur heard. No friction rub. No gallop.   Pulmonary:     Effort: Pulmonary effort is normal. No respiratory distress.     Breath sounds: Normal breath sounds. No wheezing or rales.  Abdominal:     General: Bowel sounds are normal. There is no distension.     Palpations: Abdomen is  soft. There is no mass.     Tenderness: There is no abdominal tenderness. There is no guarding or rebound.     Hernia: There is no hernia in the left inguinal area or right inguinal area.  Musculoskeletal:        General: Normal range of motion.     Cervical back: Normal range of motion and neck supple.  Lymphadenopathy:     Lower Body: No right inguinal adenopathy. No left inguinal adenopathy.  Neurological:     General: No focal deficit present.     Mental Status: She is alert and oriented to person, place, and time.     Cranial Nerves: No cranial nerve deficit.  Skin:    General: Skin is warm and dry.     Findings: No erythema.  Psychiatric:        Mood and Affect: Mood normal.        Behavior: Behavior normal.        Judgment: Judgment normal.     Endometrial Biopsy After discussion with the patient regarding her abnormal uterine bleeding I recommended that she proceed with an endometrial biopsy for further diagnosis. The risks, benefits, alternatives, and indications for an endometrial biopsy were discussed with the patient in detail. She understood the risks including infection, bleeding, cervical laceration and uterine perforation.  Verbal consent was obtained.   PROCEDURE NOTE:  Pipelle endometrial biopsy was performed using aseptic technique with iodine preparation.  The uterus was sounded to a length of 7 cm.  Adequate sampling was obtained with minimal blood loss.  The patient tolerated the procedure well.  Disposition will be pending pathology.  Female chaperone present for pelvic and breast  portions of the physical exam  Assessment:  50 y.o. G1P0 female here for  1. Menorrhagia with irregular cycle      Plan: Problem List Items Addressed This Visit   None   Visit Diagnoses    Menorrhagia with irregular cycle    -  Primary   Relevant Orders   US PELVIC COMPLETE WITH TRANSVAGINAL     Discussed management options for abnormal uterine bleeding including expectant, NSAIDs, tranexamic acid (Lysteda), oral progesterone (Provera, norethindrone, megace), Depo Provera, Levonorgestrel containing IUD, endometrial ablation (Novasure) or hysterectomy as definitive surgical management.  Discussed risks and benefits of each method.   Final management decision will hinge on results of patient's work up and whether an underlying etiology for the patients bleeding symptoms can be discerned.  We will conduct a basic work up examining using the PALM-COIEN classification system.  In the meantime the patient opts to trial nothing while we await results of her ultrasound and labs.  Printed patient education handouts were given to the patient to review at home.  Bleeding precautions reviewed.  Endometrial biopsy performed today. Will order stat ultrasound, given age and irregular bleeding.   She seems most interested in an ablation. I think that would be reasonable pending a normal endometrial biopsy and ultrasound.   Thomasene Mohair, MD 09/13/2020 3:59 PM    CC: Smitty Cords, DO [address] [place],  [place] [address]

## 2020-09-14 ENCOUNTER — Other Ambulatory Visit: Payer: Self-pay

## 2020-09-14 ENCOUNTER — Other Ambulatory Visit: Payer: Self-pay | Admitting: Obstetrics and Gynecology

## 2020-09-14 ENCOUNTER — Encounter: Payer: Self-pay | Admitting: Obstetrics and Gynecology

## 2020-09-14 ENCOUNTER — Ambulatory Visit
Admission: RE | Admit: 2020-09-14 | Discharge: 2020-09-14 | Disposition: A | Discharge status: Home / Self Care | Payer: Federal, State, Local not specified - PPO | Source: Ambulatory Visit | Attending: Obstetrics and Gynecology | Admitting: Obstetrics and Gynecology

## 2020-09-14 DIAGNOSIS — N921 Excessive and frequent menstruation with irregular cycle: Secondary | ICD-10-CM

## 2020-09-14 DIAGNOSIS — N926 Irregular menstruation, unspecified: Secondary | ICD-10-CM | POA: Diagnosis not present

## 2020-09-15 LAB — SURGICAL PATHOLOGY

## 2020-09-26 ENCOUNTER — Telehealth: Payer: Self-pay

## 2020-09-26 NOTE — Telephone Encounter (Signed)
Radiology said they are faxing report attn to me, I will get tonya to get It to me once we get it.

## 2020-09-26 NOTE — Telephone Encounter (Signed)
-----   Message from Conard Novak, MD sent at 09/26/2020  9:00 AM EDT ----- Please call radiology and ask that a report be generated for her u/s on 4/28. Thanks

## 2020-09-27 ENCOUNTER — Other Ambulatory Visit: Payer: Self-pay | Admitting: Family Medicine

## 2020-09-27 ENCOUNTER — Other Ambulatory Visit: Payer: Self-pay

## 2020-09-27 DIAGNOSIS — G43719 Chronic migraine without aura, intractable, without status migrainosus: Secondary | ICD-10-CM

## 2020-09-27 DIAGNOSIS — F419 Anxiety disorder, unspecified: Secondary | ICD-10-CM

## 2020-09-27 MED ORDER — VENLAFAXINE HCL ER 150 MG PO CP24: ORAL_CAPSULE | ORAL | 1 refills | Status: DC

## 2020-09-27 NOTE — Telephone Encounter (Signed)
Requested Prescriptions  Pending Prescriptions Disp Refills  . venlafaxine XR (EFFEXOR-XR) 150 MG 24 hr capsule 90 capsule 1    Sig: TAKE 1 CAPSULE(150 MG) BY MOUTH DAILY WITH BREAKFAST     Psychiatry: Antidepressants - SNRI - desvenlafaxine & venlafaxine Failed - 09/27/2020  9:11 AM      Failed - LDL in normal range and within 360 days    LDL Cholesterol (Calc)  Date Value Ref Range Status  07/23/2019 136 (H) mg/dL (calc) Final    Comment:    Reference range: <100 . Desirable range <100 mg/dL for primary prevention;   <70 mg/dL for patients with CHD or diabetic patients  with > or = 2 CHD risk factors. Marland Kitchen LDL-C is now calculated using the Martin-Hopkins  calculation, which is a validated novel method providing  better accuracy than the Friedewald equation in the  estimation of LDL-C.  Horald Pollen et al. Lenox Ahr. 2025;427(06): 2061-2068  (http://education.[facility].com/faq/FAQ164)          Failed - Total Cholesterol in normal range and within 360 days    Cholesterol  Date Value Ref Range Status  07/23/2019 224 (H) <200 mg/dL Final         Failed - Triglycerides in normal range and within 360 days    Triglycerides  Date Value Ref Range Status  07/23/2019 48 <150 mg/dL Final         Passed - Last BP in normal range    BP Readings from Last 1 Encounters:  09/13/20 118/74         Passed - Valid encounter within last 6 months    Recent Outpatient Visits          1 month ago Abnormal uterine bleeding (AUB)   [facility] Smitty Cords, DO   4 months ago Anxiety   [facility], Jodelle Gross, FNP   6 months ago Primary insomnia   [facility], Jodelle Gross, FNP   8 months ago Herpes zoster without complication   [facility] Smitty Cords, DO   1 year ago Routine general medical examination at health care facility   [facility], Jodelle Gross, [place]

## 2020-09-27 NOTE — Telephone Encounter (Signed)
.  Medication Refill - Medication: venlafaxine XR (EFFEXOR-XR) 150 MG 24 hr capsule, patient has 1 pill left   Has the patient contacted their pharmacy? Yes.    (Agent: If yes, when and what did the pharmacy advise?) Patient was advised by the pharmacy to contact PCP office due to several attempts and no response  Preferred Pharmacy (with phone number or street name):  [id number] [place], [place] - [address] AT [facility] OF [address] [facility] Phone:  [phone]  Fax:  [phone]        Agent: Please be advised that RX refills may take up to 3 business days. We ask that you follow-up with your pharmacy.

## 2020-10-03 ENCOUNTER — Telehealth: Payer: Self-pay

## 2020-10-03 NOTE — Telephone Encounter (Signed)
Patient had ultrasound done on 09/14/20 and is call to follow up with Report. Patient aware [facility] was waiting on the report because they didn't up load report in Epic. Patient is aware we have report now and [facility] is reviewing and will contact patient as soon as possible. Please advise

## 2020-10-10 ENCOUNTER — Telehealth: Payer: Self-pay | Admitting: Obstetrics and Gynecology

## 2020-10-10 DIAGNOSIS — R9389 Abnormal findings on diagnostic imaging of other specified body structures: Secondary | ICD-10-CM

## 2020-10-10 DIAGNOSIS — N921 Excessive and frequent menstruation with irregular cycle: Secondary | ICD-10-CM

## 2020-10-10 NOTE — Telephone Encounter (Signed)
Pt.returned Dr. Edison Pace call.

## 2020-10-10 NOTE — Telephone Encounter (Signed)
Left generic VM 

## 2020-10-10 NOTE — Telephone Encounter (Signed)
Discussed endometrial biopsy and ultrasound findings with patient.  Endometrial biopsy showed early secretory phase endometrium.  No hyperplasia or carcinoma.  Ultrasound showed an essentially normal ultrasound apart from an endometrial lining measuring 22.3 mm.  We discussed that the lining is thicker than is typical.  We discussed that the endometrial biopsy is generally fairly accurate, but can miss focal lesions or processes.  Discussed next step management options.  After discussion, plan is for surgery with hysteroscopy, dilation and curettage, removal of any endometrial masses, and endometrial ablation.  We discussed that endometrial ablation should be safe in this context with a negative biopsy.  She voiced understanding and desire to proceed with this plan.

## 2020-10-20 ENCOUNTER — Telehealth: Payer: Self-pay

## 2020-10-20 NOTE — Telephone Encounter (Signed)
LVM to rtn call 

## 2020-10-20 NOTE — Telephone Encounter (Signed)
-----   Message from Conard Novak, MD sent at 10/10/2020  4:35 PM EDT ----- Regarding: Schedule surgery Surgery Booking Request Patient Full Name:  Sharon Ponce  MRN: [id number]  DOB: 12/03/1970  Surgeon: Thomasene Mohair, MD  Requested Surgery Date and Time: TBD Primary Diagnosis AND Code:  1) Menorrhagia with irregular cycle [N92.1] 2) Thickened endometrium [R93.89]  Secondary Diagnosis and Code:  Surgical Procedure: Hysteroscopy D&C with Ablation RNFA Requested?: No L&D Notification: No Admission Status: same day surgery Length of Surgery: 50 min Special Case Needs: Yes, [facility] H&P: Yes Phone Interview???:  Yes Interpreter: No Medical Clearance:  No Special Scheduling Instructions: No Any known health/anesthesia issues, diabetes, sleep apnea, latex allergy, defibrillator/pacemaker?: No Acuity: P3   (P1 highest, P2 delay may cause harm, P3 low, elective gyn, P4 lowest)

## 2020-10-24 NOTE — Telephone Encounter (Signed)
Called patient to schedule Hysteroscopy D&C with NovaSure w Britt Boozer 6/14  H&P 6/9 @ 4:10 [place]   Pre-admit phone call appointment to be requested - date and time will be included on H&P paper work. Also all appointments will be updated on pt MyChart. Explained that this appointment has a call window. Based on the time scheduled will indicate if the call will be received within a 4 hour window before 1:00 or after.  Advised that pt may also receive calls from the hospital pharmacy and pre-service center.  Confirmed pt has [employer] as Editor, commissioning. No secondary insurance.

## 2020-10-26 ENCOUNTER — Other Ambulatory Visit: Payer: Self-pay

## 2020-10-26 ENCOUNTER — Ambulatory Visit (INDEPENDENT_AMBULATORY_CARE_PROVIDER_SITE_OTHER): Payer: Federal, State, Local not specified - PPO | Admitting: Obstetrics and Gynecology

## 2020-10-26 ENCOUNTER — Encounter: Payer: Self-pay | Admitting: Obstetrics and Gynecology

## 2020-10-26 VITALS — BP 128/82 | HR 72 | Ht 64.0 in | Wt 156.8 lb

## 2020-10-26 DIAGNOSIS — R9389 Abnormal findings on diagnostic imaging of other specified body structures: Secondary | ICD-10-CM

## 2020-10-26 DIAGNOSIS — N921 Excessive and frequent menstruation with irregular cycle: Secondary | ICD-10-CM

## 2020-10-26 NOTE — H&P (View-Only) (Signed)
Preoperative History and Physical  Sharon Ponce is a 50 y.o. G1P0 here for surgical management of menorrhagia with irregular cycle and thickened endometrium.   No significant preoperative concerns.  History of Present Illness: 50 y.o. G0 female who is having her period every other week.  She is a type A person.  She has had this for about a few months.  She has noticed over the past few years that things have become a little more abnormal with her periods in length.  However, things were more or less normal.  She has always had normal pap smears.  She feels like she is bloating during menstruation.  She has not been able to lose weight.  She has no new constipation.  With her bleeding she is not passing clots.  She changes pads every 3-4 hours during the day.  She states that she changes them frequently due to the smell. She really has no significant gynecologic history, otherwise. She denies early satiety.   Last pap smear: 03/2018: NILM, HPV negative Endometrial biopsy: early secretory phase endometrium, no hyperplasia or carcinoma. Ultrasound showed an essentially normal ultrasound apart from an endometrial lining measuring 22.3 mm.  Proposed surgery: Hysteroscopy, dilation and curettage, removal of any endometrial masses, and endometrial ablation  Past Medical History:  Diagnosis Date   Allergy    Anxiety    Migraine    Past Surgical History:  Procedure Laterality Date   BUNIONECTOMY     PLACEMENT OF BREAST IMPLANTS     OB History  Gravida Para Term Preterm AB Living  1            SAB IAB Ectopic Multiple Live Births               # Outcome Date GA Lbr Len/2nd Weight Sex Delivery Anes PTL Lv  1 Gravida           Patient denies any other pertinent gynecologic issues.   Current Outpatient Medications on File Prior to Visit  Medication Sig Dispense Refill   acidophilus (RISAQUAD) CAPS capsule Take 1 capsule by mouth daily.     B Complex-C (SUPER B COMPLEX PO) Take 1 tablet by  mouth daily.     CRANBERRY PO Take 1 tablet by mouth daily.     fluticasone (FLONASE) 50 MCG/ACT nasal spray SHAKE LQ AND U 2 SPRAYS IEN QD (Patient taking differently: Place 2 sprays into both nostrils daily.) 15.8 mL 3   melatonin 5 MG TABS Take 5 mg by mouth at bedtime.     Multiple Vitamin (MULTIVITAMIN) tablet Take 1 tablet by mouth daily.     Nutritional Supplements (DHEA PO) Take 1 capsule by mouth daily.     Omega-3 Fatty Acids (OMEGA-3 FISH OIL PO) Take 1 capsule by mouth daily.     SUMAtriptan (IMITREX) 50 MG tablet Take 1 tablet (50 mg total) by mouth once as needed for up to 1 dose for migraine. May repeat one dose in 2 hours if headache persists, for max dose 24 hours 12 tablet 2   temazepam (RESTORIL) 15 MG capsule TAKE 1 CAPSULE(15 MG) BY MOUTH AT BEDTIME AS NEEDED FOR SLEEP (Patient taking differently: Take 15 mg by mouth at bedtime.) 30 capsule 2   venlafaxine XR (EFFEXOR-XR) 150 MG 24 hr capsule TAKE 1 CAPSULE(150 MG) BY MOUTH DAILY WITH BREAKFAST (Patient taking differently: Take 150 mg by mouth daily with breakfast.) 90 capsule 1   ELDERBERRY PO Take 1 capsule by mouth daily.       medroxyPROGESTERone (PROVERA) 10 MG tablet Take 1 tablet (10 mg total) by mouth daily. For 10 days, take before menstrual cycle for abnormal bleeding. (Patient not taking: Reported on 10/25/2020) 10 tablet 0   PREVIDENT 5000 SENSITIVE 1.1-5 % PSTE USE EVERY PM AFTER BRUSHING AND FLOSSING. SPIT OUT EXCESS. NO RINSE/EAT/DRINK FOR AT LEAST 30 MINUTES AFTER APPLICATION     No current facility-administered medications on file prior to visit.   Allergies  Allergen Reactions   Codeine Nausea And Vomiting   Dust Mite Extract    Mold Extract [Trichophyton]    Tree Extract     Social History:   reports that she has been smoking cigarettes. She has a 5.00 pack-year smoking history. She has never used smokeless tobacco. She reports that she does not drink alcohol and does not use drugs.  Family History   Problem Relation Age of Onset   Healthy Mother    Healthy Father    Leukemia Maternal Grandmother    Heart disease Maternal Grandfather    Vision loss Paternal Aunt    Breast cancer Neg Hx    Colon cancer Neg Hx    Stroke Neg Hx    Ovarian cancer Neg Hx     Review of Systems: Noncontributory  PHYSICAL EXAM: Blood pressure 128/82, pulse 72, height 5\' 4"  (1.626 m), weight 156 lb 12.8 oz (71.1 kg), unknown if currently breastfeeding. CONSTITUTIONAL: Well-developed, well-nourished female in no acute distress.  HENT:  Normocephalic, atraumatic, External right and left ear normal. Oropharynx is clear and moist EYES: Conjunctivae and EOM are normal. Pupils are equal, round, and reactive to light. No scleral icterus.  NECK: Normal range of motion, supple, no masses SKIN: Skin is warm and dry. No rash noted. Not diaphoretic. No erythema. No pallor. NEUROLGIC: Alert and oriented to person, place, and time. Normal reflexes, muscle tone coordination. No cranial nerve deficit noted. PSYCHIATRIC: Normal mood and affect. Normal behavior. Normal judgment and thought content. CARDIOVASCULAR: Normal heart rate noted, regular rhythm RESPIRATORY: Effort and breath sounds normal, no problems with respiration noted ABDOMEN: Soft, nontender, nondistended. PELVIC: Deferred MUSCULOSKELETAL: Normal range of motion. No edema and no tenderness. 2+ distal pulses.  Labs: No results found for this or any previous visit (from the past 336 hour(s)).  Imaging Studies: No results found.  Assessment:   ICD-10-CM   1. Menorrhagia with irregular cycle  N92.1     2. Thickened endometrium  R93.89        Plan: Patient will undergo surgical management with the above noted surgery.   The risks of surgery were discussed in detail with the patient including but not limited to: bleeding which may require transfusion or reoperation; infection which may require antibiotics; injury to surrounding organs which may  involve bowel, bladder, ureters ; need for additional procedures including laparoscopy or laparotomy; thromboembolic phenomenon, surgical site problems and other postoperative/anesthesia complications. Likelihood of success in alleviating the patient's condition was discussed. Routine postoperative instructions will be reviewed with the patient and her family in detail after surgery.  The patient concurred with the proposed plan, giving informed written consent for the surgery.  Preoperative prophylactic antibiotics, as indicated, and SCDs ordered on call to the OR.    10-25-1999, MD 10/26/2020 4:27 PM

## 2020-10-26 NOTE — Progress Notes (Signed)
Preoperative History and Physical  Sharon Ponce is a 50 y.o. G1P0 here for surgical management of menorrhagia with irregular cycle and thickened endometrium.   No significant preoperative concerns.  History of Present Illness: 50 y.o. G0 female who is having her period every other week.  She is a type A person.  She has had this for about a few months.  She has noticed over the past few years that things have become a little more abnormal with her periods in length.  However, things were more or less normal.  She has always had normal pap smears.  She feels like she is bloating during menstruation.  She has not been able to lose weight.  She has no new constipation.  With her bleeding she is not passing clots.  She changes pads every 3-4 hours during the day.  She states that she changes them frequently due to the smell. She really has no significant gynecologic history, otherwise. She denies early satiety.   Last pap smear: 03/2018: NILM, HPV negative Endometrial biopsy: early secretory phase endometrium, no hyperplasia or carcinoma. Ultrasound showed an essentially normal ultrasound apart from an endometrial lining measuring 22.3 mm.  Proposed surgery: Hysteroscopy, dilation and curettage, removal of any endometrial masses, and endometrial ablation  Past Medical History:  Diagnosis Date   Allergy    Anxiety    Migraine    Past Surgical History:  Procedure Laterality Date   BUNIONECTOMY     PLACEMENT OF BREAST IMPLANTS     OB History  Gravida Para Term Preterm AB Living  1            SAB IAB Ectopic Multiple Live Births               # Outcome Date GA Lbr Len/2nd Weight Sex Delivery Anes PTL Lv  1 Gravida           Patient denies any other pertinent gynecologic issues.   Current Outpatient Medications on File Prior to Visit  Medication Sig Dispense Refill   acidophilus (RISAQUAD) CAPS capsule Take 1 capsule by mouth daily.     B Complex-C (SUPER B COMPLEX PO) Take 1 tablet by  mouth daily.     CRANBERRY PO Take 1 tablet by mouth daily.     fluticasone (FLONASE) 50 MCG/ACT nasal spray SHAKE LQ AND U 2 SPRAYS IEN QD (Patient taking differently: Place 2 sprays into both nostrils daily.) 15.8 mL 3   melatonin 5 MG TABS Take 5 mg by mouth at bedtime.     Multiple Vitamin (MULTIVITAMIN) tablet Take 1 tablet by mouth daily.     Nutritional Supplements (DHEA PO) Take 1 capsule by mouth daily.     Omega-3 Fatty Acids (OMEGA-3 FISH OIL PO) Take 1 capsule by mouth daily.     SUMAtriptan (IMITREX) 50 MG tablet Take 1 tablet (50 mg total) by mouth once as needed for up to 1 dose for migraine. May repeat one dose in 2 hours if headache persists, for max dose 24 hours 12 tablet 2   temazepam (RESTORIL) 15 MG capsule TAKE 1 CAPSULE(15 MG) BY MOUTH AT BEDTIME AS NEEDED FOR SLEEP (Patient taking differently: Take 15 mg by mouth at bedtime.) 30 capsule 2   venlafaxine XR (EFFEXOR-XR) 150 MG 24 hr capsule TAKE 1 CAPSULE(150 MG) BY MOUTH DAILY WITH BREAKFAST (Patient taking differently: Take 150 mg by mouth daily with breakfast.) 90 capsule 1   ELDERBERRY PO Take 1 capsule by mouth daily.  medroxyPROGESTERone (PROVERA) 10 MG tablet Take 1 tablet (10 mg total) by mouth daily. For 10 days, take before menstrual cycle for abnormal bleeding. (Patient not taking: Reported on 10/25/2020) 10 tablet 0   PREVIDENT 5000 SENSITIVE 1.1-5 % PSTE USE EVERY PM AFTER BRUSHING AND FLOSSING. SPIT OUT EXCESS. NO RINSE/EAT/DRINK FOR AT LEAST 30 MINUTES AFTER APPLICATION     No current facility-administered medications on file prior to visit.   Allergies  Allergen Reactions   Codeine Nausea And Vomiting   Dust Mite Extract    Mold Extract [Trichophyton]    Tree Extract     Social History:   reports that she has been smoking cigarettes. She has a 5.00 pack-year smoking history. She has never used smokeless tobacco. She reports that she does not drink alcohol and does not use drugs.  Family History   Problem Relation Age of Onset   Healthy Mother    Healthy Father    Leukemia Maternal Grandmother    Heart disease Maternal Grandfather    Vision loss Paternal Aunt    Breast cancer Neg Hx    Colon cancer Neg Hx    Stroke Neg Hx    Ovarian cancer Neg Hx     Review of Systems: Noncontributory  PHYSICAL EXAM: Blood pressure 128/82, pulse 72, height 5\' 4"  (1.626 m), weight 156 lb 12.8 oz (71.1 kg), unknown if currently breastfeeding. CONSTITUTIONAL: Well-developed, well-nourished female in no acute distress.  HENT:  Normocephalic, atraumatic, External right and left ear normal. Oropharynx is clear and moist EYES: Conjunctivae and EOM are normal. Pupils are equal, round, and reactive to light. No scleral icterus.  NECK: Normal range of motion, supple, no masses SKIN: Skin is warm and dry. No rash noted. Not diaphoretic. No erythema. No pallor. NEUROLGIC: Alert and oriented to person, place, and time. Normal reflexes, muscle tone coordination. No cranial nerve deficit noted. PSYCHIATRIC: Normal mood and affect. Normal behavior. Normal judgment and thought content. CARDIOVASCULAR: Normal heart rate noted, regular rhythm RESPIRATORY: Effort and breath sounds normal, no problems with respiration noted ABDOMEN: Soft, nontender, nondistended. PELVIC: Deferred MUSCULOSKELETAL: Normal range of motion. No edema and no tenderness. 2+ distal pulses.  Labs: No results found for this or any previous visit (from the past 336 hour(s)).  Imaging Studies: No results found.  Assessment:   ICD-10-CM   1. Menorrhagia with irregular cycle  N92.1     2. Thickened endometrium  R93.89        Plan: Patient will undergo surgical management with the above noted surgery.   The risks of surgery were discussed in detail with the patient including but not limited to: bleeding which may require transfusion or reoperation; infection which may require antibiotics; injury to surrounding organs which may  involve bowel, bladder, ureters ; need for additional procedures including laparoscopy or laparotomy; thromboembolic phenomenon, surgical site problems and other postoperative/anesthesia complications. Likelihood of success in alleviating the patient's condition was discussed. Routine postoperative instructions will be reviewed with the patient and her family in detail after surgery.  The patient concurred with the proposed plan, giving informed written consent for the surgery.  Preoperative prophylactic antibiotics, as indicated, and SCDs ordered on call to the OR.    10-25-1999, MD 10/26/2020 4:27 PM

## 2020-10-27 ENCOUNTER — Other Ambulatory Visit
Admission: RE | Admit: 2020-10-27 | Discharge: 2020-10-27 | Disposition: A | Discharge status: Home / Self Care | Payer: Federal, State, Local not specified - PPO | Source: Ambulatory Visit | Attending: Obstetrics and Gynecology | Admitting: Obstetrics and Gynecology

## 2020-10-27 NOTE — Patient Instructions (Signed)
Your procedure is scheduled on: Tuesday October 31, 2020. Report to Day Surgery. To find out your arrival time please call [phone] between 1PM - 3PM on Monday October 30, 2020.  Remember: Instructions that are not followed completely may result in serious medical risk,  up to and including death, or upon the discretion of your surgeon and anesthesiologist your  surgery may need to be rescheduled.     _X__ 1. Do not eat food after midnight the night before your procedure.                 No chewing gum or hard candies. You may drink clear liquids up to 2 hours                 before you are scheduled to arrive for your surgery- DO not drink clear                 liquids within 2 hours of the start of your surgery.                 Clear Liquids include:  water, apple juice without pulp, clear Gatorade, G2 or                  Gatorade Zero (avoid Red/Purple/Blue), Black Coffee or Tea (Do not add                 anything to coffee or tea).  __X__2.   Complete the "Ensure Clear Pre-surgery Clear Carbohydrate Drink" provided to you, 2 hours before arrival. **If you are diabetic you will be provided with an alternative drink, Gatorade Zero or G2.  __X__3.  On the morning of surgery brush your teeth with toothpaste and water, you                may rinse your mouth with mouthwash if you wish.  Do not swallow any toothpaste of mouthwash.     _X__ 4.  No Alcohol for 24 hours before or after surgery.   _X__ 5.  Do Not Smoke or use e-cigarettes For 24 Hours Prior to Your Surgery.                 Do not use any chewable tobacco products for at least 6 hours prior to                 Surgery.  _X__  6.  Do not use any recreational drugs (marijuana, cocaine, heroin, ecstasy, MDMA or other)                For at least one week prior to your surgery.  Combination of these drugs with anesthesia                May have life threatening results.  __X__7.  Notify your doctor if there  is any change in your medical condition      (cold, fever, infections).     Do not wear jewelry, make-up, hairpins, clips or nail polish. Do not wear lotions, powders, or perfumes. You may wear deodorant. Do not shave 48 hours prior to surgery.  Do not bring valuables to the hospital.    [facility] is not responsible for any belongings or valuables.  Contacts, dentures or bridgework may not be worn into surgery. Leave your suitcase in the car. After surgery it may be brought to your room. For patients admitted to the hospital, discharge time is determined by your treatment  team.   Patients discharged the day of surgery will not be allowed to drive home.   Make arrangements for someone to be with you for the first 24 hours of your Same Day Discharge.   __X__ Take these medicines the morning of surgery with A SIP OF WATER:    1. None   2.   3.   4.  5.  6.  ____ Fleet Enema (as directed)   __X__ Use Antibacterial Soap as directed  ____ Use Benzoyl Peroxide Gel as instructed  ____ Use inhalers on the day of surgery  ____ Stop metformin 2 days prior to surgery    ____ Take 1/2 of usual insulin dose the night before surgery. No insulin the morning          of surgery.   ____ Call your PCP, cardiologist, or Pulmonologist if taking Coumadin/Plavix/aspirin and ask when to stop before your surgery.   __X__ One Week prior to surgery- Stop Anti-inflammatories such as Ibuprofen, Aleve, Advil, Motrin, meloxicam (MOBIC), diclofenac, etodolac, ketorolac, Toradol, Daypro, piroxicam, Goody's or BC powders. OK TO USE TYLENOL IF NEEDED   __X__ Stop supplements until after surgery.    ____ Bring C-Pap to the hospital.    If you have any questions regarding your pre-procedure instructions,  Please call Pre-admit Testing at [phone].

## 2020-10-31 ENCOUNTER — Ambulatory Visit
Admission: RE | Admit: 2020-10-31 | Discharge: 2020-10-31 | Disposition: A | Discharge status: Home / Self Care | Payer: Federal, State, Local not specified - PPO | Attending: Obstetrics and Gynecology | Admitting: Obstetrics and Gynecology

## 2020-10-31 ENCOUNTER — Ambulatory Visit: Payer: Federal, State, Local not specified - PPO | Admitting: Anesthesiology

## 2020-10-31 ENCOUNTER — Encounter
Admission: RE | Disposition: A | Discharge status: Home / Self Care | Payer: Self-pay | Source: Home / Self Care | Attending: Obstetrics and Gynecology

## 2020-10-31 ENCOUNTER — Encounter: Payer: Self-pay | Admitting: Obstetrics and Gynecology

## 2020-10-31 ENCOUNTER — Other Ambulatory Visit: Payer: Self-pay

## 2020-10-31 DIAGNOSIS — F17210 Nicotine dependence, cigarettes, uncomplicated: Secondary | ICD-10-CM | POA: Insufficient documentation

## 2020-10-31 DIAGNOSIS — R9389 Abnormal findings on diagnostic imaging of other specified body structures: Secondary | ICD-10-CM

## 2020-10-31 DIAGNOSIS — Z885 Allergy status to narcotic agent status: Secondary | ICD-10-CM | POA: Diagnosis not present

## 2020-10-31 DIAGNOSIS — N921 Excessive and frequent menstruation with irregular cycle: Secondary | ICD-10-CM | POA: Diagnosis present

## 2020-10-31 DIAGNOSIS — F419 Anxiety disorder, unspecified: Secondary | ICD-10-CM

## 2020-10-31 DIAGNOSIS — Z793 Long term (current) use of hormonal contraceptives: Secondary | ICD-10-CM | POA: Insufficient documentation

## 2020-10-31 DIAGNOSIS — Z8249 Family history of ischemic heart disease and other diseases of the circulatory system: Secondary | ICD-10-CM | POA: Insufficient documentation

## 2020-10-31 DIAGNOSIS — J3089 Other allergic rhinitis: Secondary | ICD-10-CM

## 2020-10-31 DIAGNOSIS — Z79899 Other long term (current) drug therapy: Secondary | ICD-10-CM | POA: Diagnosis not present

## 2020-10-31 DIAGNOSIS — Z806 Family history of leukemia: Secondary | ICD-10-CM | POA: Insufficient documentation

## 2020-10-31 DIAGNOSIS — N920 Excessive and frequent menstruation with regular cycle: Secondary | ICD-10-CM | POA: Insufficient documentation

## 2020-10-31 DIAGNOSIS — F5101 Primary insomnia: Secondary | ICD-10-CM

## 2020-10-31 DIAGNOSIS — Z9109 Other allergy status, other than to drugs and biological substances: Secondary | ICD-10-CM | POA: Diagnosis not present

## 2020-10-31 DIAGNOSIS — G43719 Chronic migraine without aura, intractable, without status migrainosus: Secondary | ICD-10-CM

## 2020-10-31 HISTORY — PX: DILITATION & CURRETTAGE/HYSTROSCOPY WITH NOVASURE ABLATION: SHX5568

## 2020-10-31 LAB — POCT PREGNANCY, URINE: Preg Test, Ur: NEGATIVE

## 2020-10-31 SURGERY — DILATATION & CURETTAGE/HYSTEROSCOPY WITH NOVASURE ABLATION
Anesthesia: General

## 2020-10-31 MED ORDER — DEXAMETHASONE SODIUM PHOSPHATE 10 MG/ML IJ SOLN
INTRAMUSCULAR | Status: DC | PRN
  Administered 2020-10-31: 10 mg via INTRAVENOUS

## 2020-10-31 MED ORDER — PHENYLEPHRINE HCL (PRESSORS) 10 MG/ML IV SOLN
INTRAVENOUS | Status: DC | PRN
  Administered 2020-10-31: 50 ug via INTRAVENOUS

## 2020-10-31 MED ORDER — FLUTICASONE PROPIONATE 50 MCG/ACT NA SUSP: 2.0000 | Freq: Every day | NASAL | Status: DC

## 2020-10-31 MED ORDER — CHLORHEXIDINE GLUCONATE 0.12 % MT SOLN
OROMUCOSAL | Status: AC
  Filled 2020-10-31: qty 15

## 2020-10-31 MED ORDER — DEXMEDETOMIDINE (PRECEDEX) IN NS 20 MCG/5ML (4 MCG/ML) IV SYRINGE
PREFILLED_SYRINGE | INTRAVENOUS | Status: DC | PRN
  Administered 2020-10-31: 4 ug via INTRAVENOUS

## 2020-10-31 MED ORDER — SILVER NITRATE-POT NITRATE 75-25 % EX MISC
CUTANEOUS | Status: AC
  Filled 2020-10-31: qty 10

## 2020-10-31 MED ORDER — PROPOFOL 10 MG/ML IV BOLUS
INTRAVENOUS | Status: DC | PRN
  Administered 2020-10-31: 160 mg via INTRAVENOUS

## 2020-10-31 MED ORDER — EPHEDRINE SULFATE 50 MG/ML IJ SOLN
INTRAMUSCULAR | Status: DC | PRN
  Administered 2020-10-31: 10 mg via INTRAVENOUS

## 2020-10-31 MED ORDER — LACTATED RINGERS IV SOLN: INTRAVENOUS | Status: DC

## 2020-10-31 MED ORDER — LIDOCAINE HCL (CARDIAC) PF 100 MG/5ML IV SOSY
PREFILLED_SYRINGE | INTRAVENOUS | Status: DC | PRN
  Administered 2020-10-31: 100 mg via INTRAVENOUS

## 2020-10-31 MED ORDER — POVIDONE-IODINE 10 % EX SWAB
2.0000 "application " | Freq: Once | CUTANEOUS | Status: AC
  Administered 2020-10-31: 2 via TOPICAL

## 2020-10-31 MED ORDER — IBUPROFEN 600 MG PO TABS: 600.0000 mg | ORAL_TABLET | Freq: Four times a day (QID) | ORAL | 0 refills | Status: DC | PRN

## 2020-10-31 MED ORDER — MEPERIDINE HCL 25 MG/ML IJ SOLN: 6.2500 mg | INTRAMUSCULAR | Status: DC | PRN

## 2020-10-31 MED ORDER — FENTANYL CITRATE (PF) 100 MCG/2ML IJ SOLN
INTRAMUSCULAR | Status: AC
  Filled 2020-10-31: qty 2

## 2020-10-31 MED ORDER — SODIUM CHLORIDE 0.9 % IR SOLN
Status: DC | PRN
  Administered 2020-10-31: 1000 mL

## 2020-10-31 MED ORDER — FENTANYL CITRATE (PF) 100 MCG/2ML IJ SOLN: 25.0000 ug | INTRAMUSCULAR | Status: DC | PRN

## 2020-10-31 MED ORDER — ONDANSETRON HCL 4 MG/2ML IJ SOLN
INTRAMUSCULAR | Status: DC | PRN
  Administered 2020-10-31: 4 mg via INTRAVENOUS

## 2020-10-31 MED ORDER — CHLORHEXIDINE GLUCONATE 0.12 % MT SOLN
15.0000 mL | Freq: Once | OROMUCOSAL | Status: AC
  Administered 2020-10-31: 15 mL via OROMUCOSAL

## 2020-10-31 MED ORDER — ACETAMINOPHEN 10 MG/ML IV SOLN
INTRAVENOUS | Status: DC | PRN
  Administered 2020-10-31: 1000 mg via INTRAVENOUS

## 2020-10-31 MED ORDER — TEMAZEPAM 15 MG PO CAPS: 15.0000 mg | ORAL_CAPSULE | Freq: Every day | ORAL | Status: DC

## 2020-10-31 MED ORDER — TRAMADOL HCL 50 MG PO TABS: 50.0000 mg | ORAL_TABLET | Freq: Four times a day (QID) | ORAL | 0 refills | Status: DC | PRN

## 2020-10-31 MED ORDER — LIDOCAINE HCL URETHRAL/MUCOSAL 2 % EX GEL
CUTANEOUS | Status: AC
  Filled 2020-10-31: qty 5

## 2020-10-31 MED ORDER — VENLAFAXINE HCL ER 150 MG PO CP24: 150.0000 mg | ORAL_CAPSULE | Freq: Every day | ORAL | Status: DC

## 2020-10-31 MED ORDER — ORAL CARE MOUTH RINSE: 15.0000 mL | Freq: Once | OROMUCOSAL | Status: AC

## 2020-10-31 MED ORDER — LIDOCAINE HCL (PF) 2 % IJ SOLN
INTRAMUSCULAR | Status: AC
  Filled 2020-10-31: qty 5

## 2020-10-31 MED ORDER — ONDANSETRON HCL 4 MG/2ML IJ SOLN
4.0000 mg | Freq: Once | INTRAMUSCULAR | Status: DC | PRN
Start: 2020-10-31 — End: 2020-10-31

## 2020-10-31 MED ORDER — SILVER NITRATE-POT NITRATE 75-25 % EX MISC
CUTANEOUS | Status: DC | PRN
  Administered 2020-10-31: 5 via TOPICAL

## 2020-10-31 MED ORDER — MIDAZOLAM HCL 2 MG/2ML IJ SOLN
INTRAMUSCULAR | Status: AC
  Filled 2020-10-31: qty 2

## 2020-10-31 MED ORDER — FENTANYL CITRATE (PF) 100 MCG/2ML IJ SOLN
INTRAMUSCULAR | Status: DC | PRN
  Administered 2020-10-31: 50 ug via INTRAVENOUS
  Administered 2020-10-31 (×2): 25 ug via INTRAVENOUS

## 2020-10-31 MED ORDER — PROPOFOL 10 MG/ML IV BOLUS
INTRAVENOUS | Status: AC
  Filled 2020-10-31: qty 20

## 2020-10-31 MED ORDER — MIDAZOLAM HCL 2 MG/2ML IJ SOLN
INTRAMUSCULAR | Status: DC | PRN
  Administered 2020-10-31: 2 mg via INTRAVENOUS

## 2020-10-31 MED ORDER — ACETAMINOPHEN 10 MG/ML IV SOLN
INTRAVENOUS | Status: AC
  Filled 2020-10-31: qty 100

## 2020-10-31 SURGICAL SUPPLY — 26 items
ABLATOR SURESOUND NOVASURE (ABLATOR) ×4 IMPLANT
BAG DRN RND TRDRP ANRFLXCHMBR (UROLOGICAL SUPPLIES)
BAG URINE DRAIN 2000ML AR STRL (UROLOGICAL SUPPLIES) IMPLANT
CATH FOLEY 2WAY  5CC 16FR (CATHETERS)
CATH FOLEY 2WAY 5CC 16FR (CATHETERS)
CATH ROBINSON RED A/P 16FR (CATHETERS) ×2 IMPLANT
CATH URTH 16FR FL 2W BLN LF (CATHETERS) IMPLANT
COVER WAND RF STERILE (DRAPES) ×2 IMPLANT
DEVICE MYOSURE LITE (MISCELLANEOUS) ×2 IMPLANT
DEVICE MYOSURE REACH (MISCELLANEOUS) IMPLANT
ELECT REM PT RETURN 9FT ADLT (ELECTROSURGICAL) ×2
ELECTRODE REM PT RTRN 9FT ADLT (ELECTROSURGICAL) ×1 IMPLANT
GLOVE SURG ENC MOIS LTX SZ7 (GLOVE) ×2 IMPLANT
GLOVE SURG UNDER POLY LF SZ7.5 (GLOVE) ×2 IMPLANT
GOWN STRL REUS W/ TWL LRG LVL3 (GOWN DISPOSABLE) ×2 IMPLANT
GOWN STRL REUS W/TWL LRG LVL3 (GOWN DISPOSABLE) ×4
IV NS IRRIG 3000ML ARTHROMATIC (IV SOLUTION) ×2 IMPLANT
KIT PROCEDURE FLUENT (KITS) ×2 IMPLANT
KIT TURNOVER CYSTO (KITS) ×2 IMPLANT
MANIFOLD NEPTUNE II (INSTRUMENTS) ×2 IMPLANT
PACK DNC HYST (MISCELLANEOUS) ×2 IMPLANT
PAD OB MATERNITY 4.3X12.25 (PERSONAL CARE ITEMS) ×2 IMPLANT
PAD PREP 24X41 OB/GYN DISP (PERSONAL CARE ITEMS) ×2 IMPLANT
SEAL ROD LENS SCOPE MYOSURE (ABLATOR) ×2 IMPLANT
SURGILUBE 2OZ TUBE FLIPTOP (MISCELLANEOUS) ×2 IMPLANT
TUBING CONNECTING 10 (TUBING) ×2 IMPLANT

## 2020-10-31 NOTE — Op Note (Signed)
Operative Note    Name: Sharon Ponce  Date of Birth: Oct 05, 1970  Date of Service: 10/31/2020  MRN: [id number]   PRE-OP DIAGNOSIS:  1) Menorrhagia with irregular cycle [N92.1] 2) Thickened endometrium [R93.89]   POST-OP DIAGNOSIS:  1) Menorrhagia with irregular cycle [N92.1] 2) Thickened endometrium [R93.89]  SURGEON: Conard Novak, MD  PROCEDURE:  1) Hysteroscopy, dilation and curettage 2) Endometrial ablation using NovaSure  ANESTHESIA: General LMA   ESTIMATED BLOOD LOSS: 41mL   IV Fluid: 700 mL crystalloid  SPECIMENS: endometrial curettings  FLUID DEFICIT: 180 mL   COMPLICATIONS: none   DISPOSITION: PACU - hemodynamically stable.   CONDITION: stable   FINDINGS: Exam under anesthesia revealed small, mobile uterus with no masses and bilateral adnexa without masses or fullness. Hysteroscopy revealed thickened and fluffy endometrial (especially anterior) lining, otherwise grossly normal appearing uterine cavity with bilateral tubal ostia and normal appearing endocervical canal. Findings after ablation revealed globally ablated endometrium.    PROCEDURE IN DETAIL: After informed consent was obtained, the patient was taken to the operating room where anesthesia was obtained without difficulty. The patient was positioned in the dorsal lithotomy position in candy Allen stirrups. She was prepped and draped in the usual, sterile fashion.  After a Time Out was called, the patient's bladder was catheterized with an in and out foley catheter. The bivalved speculum was placed inside the patient's vagina, and the the anterior lip of the cervix was grasped with the tenaculum. The cervix was progressively dilated to a 30mm Hegar dilator. The MyoSure hysteroscope was introduced, with LR fluid used to distend the intrauterine cavity, with the above noted findings.   Using the MyoSure device the thickened portions of the endometrium were sampled and thinned as much as possible.    The  hystersocope was removed and the uterine cavity was curetted until a gritty texture was noted, yielding moderate endometrial curettings.    The NovaSure device was then placed without difficulty. Measurements were obtained. Patient was noted to have a uterine length of 4.5 cm, a cervical length of 3.5 cm, and a uterine width of 4.2 cm. The NovaSure device was first tested and after confirmation the procedure performed. Length of procedure was 51 seconds. The NovaSure device was then removed and repeat hysteroscopy revealed an appropriate lining of the uterus and no perforation or injury with global ablation effect noted. Hysteroscope was removed.   Tenaculum was removed with hemostasis noted after application of silver nitrate to the tenaculum entry sites. The vagina was inspected and no instruments or sponges remained.  The speculum was removed.    The patient tolerated the procedure well. Sponge, lap and needle counts were correct x2. The patient was taken to recovery room in excellent condition.  Thomasene Mohair, MD 10/31/2020 1:03 PM

## 2020-10-31 NOTE — Interval H&P Note (Signed)
History and Physical Interval Note:  10/31/2020 11:38 AM  Sharon Ponce  has presented today for surgery, with the diagnosis of Menorrhagia with irregular cycle N92.1  Thickened endometrium R93.89.  The various methods of treatment have been discussed with the patient and family. After consideration of risks, benefits and other options for treatment, the patient has consented to  Procedure(s): DILATATION & CURETTAGE/HYSTEROSCOPY WITH NOVASURE ABLATION (N/A) as a surgical intervention.  The patient's history has been reviewed, patient examined, no change in status, stable for surgery.  I have reviewed the patient's chart and labs.  Questions were answered to the patient's satisfaction.     Thomasene Mohair, MD, [facility], [facility] 10/31/2020 11:39 AM

## 2020-10-31 NOTE — Discharge Instructions (Signed)
AMBULATORY SURGERY  ?DISCHARGE INSTRUCTIONS ? ? ?The drugs that you were given will stay in your system until tomorrow so for the next 24 hours you should not: ? ?Drive an automobile ?Make any legal decisions ?Drink any alcoholic beverage ? ? ?You may resume regular meals tomorrow.  Today it is better to start with liquids and gradually work up to solid foods. ? ?You may eat anything you prefer, but it is better to start with liquids, then soup and crackers, and gradually work up to solid foods. ? ? ?Please notify your doctor immediately if you have any unusual bleeding, trouble breathing, redness and pain at the surgery site, drainage, fever, or pain not relieved by medication. ? ? ? ?Additional Instructions: ? ? ? ?Please contact your physician with any problems or Same Day Surgery at [phone], Monday through Friday 6 am to 4 pm, or [place] [facility] [place] Main number at [phone].  ?

## 2020-10-31 NOTE — Anesthesia Postprocedure Evaluation (Signed)
Anesthesia Post Note  Patient: Sharon Ponce  Procedure(s) Performed: DILATATION & CURETTAGE/HYSTEROSCOPY WITH NOVASURE ABLATION  Patient location during evaluation: PACU Anesthesia Type: General Level of consciousness: awake and alert, awake and oriented Pain management: pain level controlled Vital Signs Assessment: post-procedure vital signs reviewed and stable Respiratory status: spontaneous breathing, nonlabored ventilation and respiratory function stable Cardiovascular status: blood pressure returned to baseline and stable Postop Assessment: no apparent nausea or vomiting Anesthetic complications: no   No notable events documented.   Last Vitals:  Vitals:   10/31/20 1345 10/31/20 1356  BP: 112/71 119/71  Pulse: 72 63  Resp: 19 16  Temp:  (!) 36.1 C  SpO2: 95% 99%    Last Pain:  Vitals:   10/31/20 1356  TempSrc: Temporal  PainSc: 0-No pain                 Manfred Arch

## 2020-10-31 NOTE — Transfer of Care (Signed)
Immediate Anesthesia Transfer of Care Note  Patient: Sharon Ponce  Procedure(s) Performed: DILATATION & CURETTAGE/HYSTEROSCOPY WITH NOVASURE ABLATION  Patient Location: PACU  Anesthesia Type:General  Level of Consciousness: awake, alert  and oriented  Airway & Oxygen Therapy: Patient Spontanous Breathing and Patient connected to face mask oxygen  Post-op Assessment: Report given to RN and Post -op Vital signs reviewed and stable  Post vital signs: Reviewed and stable  Last Vitals:  Vitals Value Taken Time  BP 113/72 10/31/20 1315  Temp 36.2 C 10/31/20 1315  Pulse 88 10/31/20 1320  Resp 13 10/31/20 1320  SpO2 98 % 10/31/20 1320  Vitals shown include unvalidated device data.  Last Pain:  Vitals:   10/31/20 1018  TempSrc: Oral  PainSc: 0-No pain         Complications: No notable events documented.

## 2020-10-31 NOTE — Anesthesia Preprocedure Evaluation (Signed)
Anesthesia Evaluation  Patient identified by MRN, date of birth, ID band Patient awake    Reviewed: Allergy & Precautions, NPO status , Patient's Chart, lab work & pertinent test results  Airway Mallampati: II  TM Distance: >3 FB Neck ROM: Full    Dental no notable dental hx.    Pulmonary neg pulmonary ROS, Current Smoker and Patient abstained from smoking.,    Pulmonary exam normal        Cardiovascular negative cardio ROS Normal cardiovascular exam     Neuro/Psych  Headaches, Anxiety negative psych ROS   GI/Hepatic negative GI ROS, Neg liver ROS,   Endo/Other  negative endocrine ROS  Renal/GU negative Renal ROS  negative genitourinary   Musculoskeletal negative musculoskeletal ROS (+)   Abdominal   Peds negative pediatric ROS (+)  Hematology negative hematology ROS (+)   Anesthesia Other Findings Anxiety    Migraine       Reproductive/Obstetrics negative OB ROS                             Anesthesia Physical Anesthesia Plan  ASA: 2  Anesthesia Plan: General   Post-op Pain Management:    Induction: Intravenous  PONV Risk Score and Plan: 2 and Ondansetron  Airway Management Planned: LMA  Additional Equipment:   Intra-op Plan:   Post-operative Plan: Extubation in OR  Informed Consent: I have reviewed the patients History and Physical, chart, labs and discussed the procedure including the risks, benefits and alternatives for the proposed anesthesia with the patient or authorized representative who has indicated his/her understanding and acceptance.       Plan Discussed with: CRNA and Anesthesiologist  Anesthesia Plan Comments:         Anesthesia Quick Evaluation

## 2020-10-31 NOTE — Anesthesia Procedure Notes (Signed)
Procedure Name: LMA Insertion Date/Time: 10/31/2020 11:53 AM Performed by: Henrietta Hoover, CRNA Pre-anesthesia Checklist: Patient identified, Patient being monitored, Timeout performed, Emergency Drugs available and Suction available Patient Re-evaluated:Patient Re-evaluated prior to induction Oxygen Delivery Method: Circle system utilized Preoxygenation: Pre-oxygenation with 100% oxygen Induction Type: IV induction Ventilation: Mask ventilation without difficulty LMA: LMA inserted LMA Size: 4.0 Tube type: Oral Number of attempts: 1 Placement Confirmation: positive ETCO2 and breath sounds checked- equal and bilateral Tube secured with: Tape Dental Injury: Teeth and Oropharynx as per pre-operative assessment

## 2020-11-01 ENCOUNTER — Encounter: Payer: Self-pay | Admitting: Obstetrics and Gynecology

## 2020-11-01 LAB — SURGICAL PATHOLOGY

## 2020-11-08 DIAGNOSIS — D225 Melanocytic nevi of trunk: Secondary | ICD-10-CM | POA: Diagnosis not present

## 2020-11-08 DIAGNOSIS — L578 Other skin changes due to chronic exposure to nonionizing radiation: Secondary | ICD-10-CM | POA: Diagnosis not present

## 2020-11-08 DIAGNOSIS — L821 Other seborrheic keratosis: Secondary | ICD-10-CM | POA: Diagnosis not present

## 2020-11-08 DIAGNOSIS — L570 Actinic keratosis: Secondary | ICD-10-CM | POA: Diagnosis not present

## 2020-11-14 ENCOUNTER — Telehealth: Payer: Self-pay | Admitting: Neurology

## 2020-11-14 ENCOUNTER — Encounter: Payer: Self-pay | Admitting: Neurology

## 2020-11-14 ENCOUNTER — Ambulatory Visit: Payer: Federal, State, Local not specified - PPO | Admitting: Neurology

## 2020-11-14 ENCOUNTER — Ambulatory Visit (INDEPENDENT_AMBULATORY_CARE_PROVIDER_SITE_OTHER): Payer: Federal, State, Local not specified - PPO | Admitting: Obstetrics and Gynecology

## 2020-11-14 ENCOUNTER — Encounter: Payer: Self-pay | Admitting: Obstetrics and Gynecology

## 2020-11-14 ENCOUNTER — Other Ambulatory Visit: Payer: Self-pay

## 2020-11-14 VITALS — BP 114/81 | HR 71 | Ht 64.0 in | Wt 157.0 lb

## 2020-11-14 VITALS — BP 100/68 | HR 68 | Ht 64.0 in | Wt 156.0 lb

## 2020-11-14 DIAGNOSIS — G43709 Chronic migraine without aura, not intractable, without status migrainosus: Secondary | ICD-10-CM

## 2020-11-14 DIAGNOSIS — Z09 Encounter for follow-up examination after completed treatment for conditions other than malignant neoplasm: Secondary | ICD-10-CM

## 2020-11-14 MED ORDER — UBRELVY 100 MG PO TABS: 100.0000 mg | ORAL_TABLET | ORAL | 11 refills | Status: DC | PRN

## 2020-11-14 MED ORDER — RIZATRIPTAN BENZOATE 10 MG PO TBDP: 10.0000 mg | ORAL_TABLET | ORAL | 11 refills | Status: DC | PRN

## 2020-11-14 MED ORDER — ONDANSETRON 4 MG PO TBDP: 4.0000 mg | ORAL_TABLET | Freq: Three times a day (TID) | ORAL | 3 refills | Status: DC | PRN

## 2020-11-14 NOTE — Patient Instructions (Addendum)
We will call for botox appointment   OnabotulinumtoxinA injection (Medical Use) What is this medication? ONABOTULINUMTOXINA (o na BOTT you lye num tox in eh) is a neuro-muscular blocker. This medicine is used to treat crossed eyes, eyelid spasms, severe neck muscle spasms, ankle and toe muscle spasms, and elbow, wrist, and finger muscle spasms. It is also used to treat excessive underarm sweating, to prevent chronic migraine headaches, and to treat loss of bladder control due toneurologic conditions such as multiple sclerosis or spinal cord injury. This medicine may be used for other purposes; ask your health care provider orpharmacist if you have questions. COMMON BRAND NAME(S): Botox What should I tell my care team before I take this medication? They need to know if you have any of these conditions: breathing problems cerebral palsy spasms difficulty urinating heart problems history of surgery where this medicine is going to be used infection at the site where this medicine is going to be used myasthenia gravis or other neurologic disease nerve or muscle disease surgery plans take medicines that treat or prevent blood clots thyroid problems an unusual or allergic reaction to botulinum toxin, albumin, other medicines, foods, dyes, or preservatives pregnant or trying to get pregnant breast-feeding How should I use this medication? This medicine is for injection into a muscle. It is given by a health careprofessional in a hospital or clinic setting. Talk to your pediatrician regarding the use of this medicine in children. While this drug may be prescribed for children as young as 2 years old for selectedconditions, precautions do apply. Overdosage: If you think you have taken too much of this medicine contact apoison control center or emergency room at once. NOTE: This medicine is only for you. Do not share this medicine with others. What if I miss a dose? This does not apply. What may  interact with this medication? aminoglycoside antibiotics like gentamicin, neomycin, tobramycin muscle relaxants other botulinum toxin injections This list may not describe all possible interactions. Give your health care provider a list of all the medicines, herbs, non-prescription drugs, or dietary supplements you use. Also tell them if you smoke, drink alcohol, or use illegaldrugs. Some items may interact with your medicine. What should I watch for while using this medication? Visit your doctor for regular check ups. This medicine will cause weakness in the muscle where it is injected. Tell your doctor if you feel unusually weak in other muscles. Get medical help right awayif you have problems with breathing, swallowing, or talking. This medicine might make your eyelids droop or make you see blurry or double. If you have weak muscles or trouble seeing do not drive a car, use machinery,or do other dangerous activities. This medicine contains albumin from human blood. It may be possible to pass an infection in this medicine, but no cases have been reported. Talk to yourdoctor about the risks and benefits of this medicine. If your activities have been limited by your condition, go back to your regularroutine slowly after treatment with this medicine. What side effects may I notice from receiving this medication? Side effects that you should report to your doctor or health care professionalas soon as possible: allergic reactions like skin rash, itching or hives, swelling of the face, lips, or tongue breathing problems changes in vision chest pain or tightness eye irritation, pain fast, irregular heartbeat infection numbness speech problems swallowing problems unusual weakness Side effects that usually do not require medical attention (report to yourdoctor or health care professional if they continue or  are bothersome): bruising or pain at site where injected drooping eyelid dry eyes or  mouth headache muscles aches, pains sensitivity to light tearing This list may not describe all possible side effects. Call your doctor for medical advice about side effects. You may report side effects to FDA at1-800-FDA-1088. Where should I keep my medication? This drug is given in a hospital or clinic and will not be stored at home. NOTE: This sheet is a summary. It may not cover all possible information. If you have questions about this medicine, talk to your doctor, pharmacist, orhealth care provider.  2022 Elsevier/Gold Standard (2017-11-10 14:21:42)

## 2020-11-14 NOTE — Progress Notes (Signed)
   Postoperative Follow-up Patient presents post op from Hysteroscopy, dilation and curettage, endometrial ablation with NovaSure 2 weeks ago for menorrhagia with irregular cycle and thickened endometrium.  Subjective: Eating a regular diet without difficulty. The patient is not having any pain.  Activity: normal activities of daily living.  She denies fever, chills, nausea, and vomiting.   Pathology Report 10/31/2020: DIAGNOSIS:  A. ENDOMETRIAL CURETTINGS; DILATATION AND CURETTAGE:  - SECRETORY ENDOMETRIUM.  - SMALL FRAGMENTS OF MYOMETRIUM.  - ENDOCERVICAL GLANDULAR MUCOSA WITH SQUAMOUS METAPLASIA.  - NEGATIVE FOR ATYPIA / EIN AND MALIGNANCY.    Objective: Vital Signs: BP 100/68 (Cuff Size: Normal)   Pulse 68   Ht 5\' 4"  (1.626 m)   Wt 156 lb (70.8 kg)   BMI 26.78 kg/m  Physical Exam Constitutional:      General: She is not in acute distress.    Appearance: Normal appearance.  HENT:     Head: Normocephalic and atraumatic.  Eyes:     General: No scleral icterus.    Conjunctiva/sclera: Conjunctivae normal.  Neurological:     General: No focal deficit present.     Mental Status: She is alert and oriented to person, place, and time.     Cranial Nerves: No cranial nerve deficit.  Psychiatric:        Mood and Affect: Mood normal.        Behavior: Behavior normal.        Judgment: Judgment normal.     Assessment: 50 y.o. s/p above surgery progressing well  Plan: Patient has done well after surgery with no apparent complications.  I have discussed the post-operative course to date, and the expected progress moving forward.  The patient understands what complications to be concerned about.  I will see the patient in routine follow up, or sooner if needed.    Activity plan: No restriction.  42, MD  11/14/2020, 2:53 PM

## 2020-11-14 NOTE — Telephone Encounter (Signed)
Botox charge sheet completed, signed, and given to Botox coordinator.  Patient also signed Botox consent form while in the office today.

## 2020-11-14 NOTE — Progress Notes (Addendum)
[facility]    Provider:  Dr Lucia Gaskins Requesting Provider: Anitra Lauth Jodelle Gross, FNP Primary Care Provider:  Smitty Cords, DO  CC:  migraines  HPI:  Sharon Ponce is a 50 y.o. female here as requested by Tarri Fuller, FNP for migraines. PMHx chronic migraine,anxiety.   Patient is here alone and reports that migraine started in 2018 and became worse when the pandemic started in 2020, she did not have them when she was younger so she thinks they may be menopausal, she can have migraines half the month, Imitrex works sometimes but she can wake up 3 AM with a migraine and Imitrex as marked for that, she had cosmetic Botox pre-COVID and later realized it was helping the migraines.  She is tried Imitrex, over-the-counter nasal spray, Effexor, Advil, Excedrin, Tylenol. She noticed improvement with cosmetic botox and had to stop during pandemic and noticed the severity worsening. Start on tleft, pulsating, pounding. Throbbing, taking imitrex very early may help, radiates to behind the eye, extreme light sensitivity, sound sensitivity, nausea, movement makes it worse, a dark quiet room or dark sunglasses make it better, heat can be a trigger, getting worse with changes perimenopausal. They can last all day or days, out of a month. Can radiate to the neck. She has 15 migraine days a month lasts > 12 hours ongoing for over a year. No aura, no medication overuse. No other focal neurologic deficits, associated symptoms, inciting events or modifiable factors.No vision changes. Not exertional or positional. No new quality.   Reviewed notes, labs and imaging from outside physicians, which showed:  I reviewed Dr. Althea Charon notes, migraines are a chronic problem, ongoing since May 2020 and more recently in October 2021, she has had Botox injections cosmetically, and good results with infrequent migraines,propranolol is contraindicated because of hypotention, gabapentin.  From a review of  records, medications tried that can be used in migraine management include: Imitrex, over-the-counter nasal spray, Effexor, Advil, Excedrin, Tylenol, Decadron injections, Zofran injections, sertraline, tramadol,amitriptyline, maxalt, zolmitriptan  Review of Systems: Patient complains of symptoms per HPI as well as the following symptoms headaches. Pertinent negatives and positives per HPI. All others negative.   Social History   Socioeconomic History   Marital status: Single    Spouse name: Not on file   Number of children: Not on file   Years of education: Not on file   Highest education level: Master's degree (e.g., MA, MS, MEng, MEd, MSW, MBA)  Occupational History   Occupation: Agricultural engineer: [employer]  Tobacco Use   Smoking status: Some Days    Packs/day: 0.25    Years: 20.00    Pack years: 5.00    Types: Cigarettes   Smokeless tobacco: Never   Tobacco comments:    some days  Vaping Use   Vaping Use: Never used  Substance and Sexual Activity   Alcohol use: Never   Drug use: Never   Sexual activity: Not Currently    Birth control/protection: None  Other Topics Concern   Not on file  Social History Narrative   Lives alone    Right handed   Caffeine: approx 6 cups/day   Social Determinants of Health   Financial Resource Strain: Not on file  Food Insecurity: Not on file  Transportation Needs: Not on file  Physical Activity: Not on file  Stress: Not on file  Social Connections: Not on file  Intimate Partner Violence: Not on file    Family History  Problem  Relation Age of Onset   Healthy Mother    Healthy Father    Migraines Father    Leukemia Maternal Grandmother    Heart disease Maternal Grandfather    Vision loss Paternal Aunt    Migraines Maternal Aunt    Breast cancer Neg Hx    Colon cancer Neg Hx    Stroke Neg Hx    Ovarian cancer Neg Hx     Past Medical History:  Diagnosis Date   Allergy    Anxiety    Migraine     Patient Active  Problem List   Diagnosis Date Noted   Chronic migraine without aura without status migrainosus, not intractable 11/14/2020   Menorrhagia with irregular cycle 10/31/2020   Thickened endometrium 10/31/2020   Abnormal uterine bleeding (AUB) 08/28/2020   Intractable chronic migraine without aura and without status migrainosus 08/28/2020   Colon cancer screening 03/17/2020   Routine general medical examination at health care facility 07/09/2019   Primary insomnia 05/04/2018   Anxiety 05/04/2018    Past Surgical History:  Procedure Laterality Date   BUNIONECTOMY     DILITATION & CURRETTAGE/HYSTROSCOPY WITH NOVASURE ABLATION N/A 10/31/2020   Procedure: DILATATION & CURETTAGE/HYSTEROSCOPY WITH NOVASURE ABLATION;  Surgeon: Conard Novak, MD;  Location: [facility] ORS;  Service: Gynecology;  Laterality: N/A;   PLACEMENT OF BREAST IMPLANTS  2005   TOOTH EXTRACTION  2012    Current Outpatient Medications  Medication Sig Dispense Refill   acidophilus (RISAQUAD) CAPS capsule Take 1 capsule by mouth daily.     B Complex-C (SUPER B COMPLEX PO) Take 1 tablet by mouth daily.     CRANBERRY PO Take 1 tablet by mouth daily.     ELDERBERRY PO Take 1 capsule by mouth daily.     fluticasone (FLONASE) 50 MCG/ACT nasal spray Place 2 sprays into both nostrils daily.     ibuprofen (ADVIL) 200 MG tablet Take 200-800 mg by mouth as needed.     melatonin 5 MG TABS Take 5 mg by mouth at bedtime.     Multiple Vitamin (MULTIVITAMIN) tablet Take 1 tablet by mouth daily.     Nutritional Supplements (DHEA PO) Take 1 capsule by mouth daily.     Omega-3 Fatty Acids (OMEGA-3 FISH OIL PO) Take 1 capsule by mouth daily.     ondansetron (ZOFRAN-ODT) 4 MG disintegrating tablet Take 1-2 tablets (4-8 mg total) by mouth every 8 (eight) hours as needed. May take with rizatriptan for migraine or alone for nausea/dizziness. 30 tablet 3   rizatriptan (MAXALT-MLT) 10 MG disintegrating tablet Take 1 tablet (10 mg total) by mouth as  needed for migraine. May repeat in 2 hours if needed 9 tablet 11   SUMAtriptan (IMITREX) 50 MG tablet Take 1 tablet (50 mg total) by mouth once as needed for up to 1 dose for migraine. May repeat one dose in 2 hours if headache persists, for max dose 24 hours 12 tablet 2   temazepam (RESTORIL) 15 MG capsule Take 1 capsule (15 mg total) by mouth at bedtime.     traMADol (ULTRAM) 50 MG tablet Take 1 tablet (50 mg total) by mouth every 6 (six) hours as needed (breakthrough pain). 20 tablet 0   Ubrogepant (UBRELVY) 100 MG TABS Take 100 mg by mouth every 2 (two) hours as needed. Maximum 200mg  a day. 16 tablet 11   venlafaxine XR (EFFEXOR-XR) 150 MG 24 hr capsule Take 1 capsule (150 mg total) by mouth daily with breakfast.  No current facility-administered medications for this visit.    Allergies as of 11/14/2020 - Review Complete 11/14/2020  Allergen Reaction Noted   Codeine Nausea And Vomiting 01/23/2018   Dust mite extract  07/09/2019   Mold extract [trichophyton]  07/09/2019   Tree extract  07/09/2019    Vitals: BP 114/81 (BP Location: Right Arm, Patient Position: Sitting)   Pulse 71   Ht 5\' 4"  (1.626 m)   Wt 157 lb (71.2 kg)   BMI 26.95 kg/m  Last Weight:  Wt Readings from Last 1 Encounters:  11/14/20 157 lb (71.2 kg)   Last Height:   Ht Readings from Last 1 Encounters:  11/14/20 5\' 4"  (1.626 m)     Physical exam: Exam: Gen: NAD, conversant, well nourised, well groomed                     CV: RRR, no MRG. No Carotid Bruits. No peripheral edema, warm, nontender Eyes: Conjunctivae clear without exudates or hemorrhage  Neuro: Detailed Neurologic Exam  Speech:    Speech is normal; fluent and spontaneous with normal comprehension.  Cognition:    The patient is oriented to person, place, and time;     recent and remote memory intact;     language fluent;     normal attention, concentration,     fund of knowledge Cranial Nerves:    The pupils are equal, round, and  reactive to light. The fundi are flat. Visual fields are full to finger confrontation. Extraocular movements are intact. Trigeminal sensation is intact and the muscles of mastication are normal. The face is symmetric. The palate elevates in the midline. Hearing intact. Voice is normal. Shoulder shrug is normal. The tongue has normal motion without fasciculations.   Coordination:    Normal finger to nose and heel to shin. Normal rapid alternating movements.   Gait:    normal.   Motor Observation:    No asymmetry, no atrophy, and no involuntary movements noted. Tone:    Normal muscle tone.    Posture:    Posture is normal. normal erect    Strength:    Strength is V/V in the upper and lower limbs.      Sensation: intact to LT     Reflex Exam:  DTR's:    Deep tendon reflexes in the upper and lower extremities are normal bilaterally.   Toes:    The toes are downgoing bilaterally.   Clonus:    Clonus is absent.    Assessment/Plan:  Patient with chronic migraines, no aura, no medication overuse, start Botox for migraines.   Orders Placed This Encounter  Procedures   CBC with Differential/Platelets   Comprehensive metabolic panel   TSH    Meds ordered this encounter  Medications   rizatriptan (MAXALT-MLT) 10 MG disintegrating tablet    Sig: Take 1 tablet (10 mg total) by mouth as needed for migraine. May repeat in 2 hours if needed    Dispense:  9 tablet    Refill:  11   ondansetron (ZOFRAN-ODT) 4 MG disintegrating tablet    Sig: Take 1-2 tablets (4-8 mg total) by mouth every 8 (eight) hours as needed. May take with rizatriptan for migraine or alone for nausea/dizziness.    Dispense:  30 tablet    Refill:  3   Ubrogepant (UBRELVY) 100 MG TABS    Sig: Take 100 mg by mouth every 2 (two) hours as needed. Maximum 200mg  a day.    Dispense:  16 tablet    Refill:  11    Failed sumatriptan, rizatriptan and zolmitriptan     Cc: Malfi, Jodelle GrossNicole M, FNP,  Althea CharonKaramalegos, Netta NeatAlexander J,  DO  Naomie DeanAntonia Jocelynne Duquette, MD  [facility] [address] [place], [place] [address]  Phone [phone] Fax [phone]

## 2020-11-14 NOTE — Telephone Encounter (Signed)
G43.709. Please initiate chronic migraine protocol.

## 2020-11-15 LAB — CBC WITH DIFFERENTIAL/PLATELET
Basophils Absolute: 0.1 10*3/uL (ref 0.0–0.2)
Basos: 1 %
EOS (ABSOLUTE): 0.4 10*3/uL (ref 0.0–0.4)
Eos: 4 %
Hematocrit: 41.4 % (ref 34.0–46.6)
Hemoglobin: 13.9 g/dL (ref 11.1–15.9)
Immature Grans (Abs): 0 10*3/uL (ref 0.0–0.1)
Immature Granulocytes: 0 %
Lymphocytes Absolute: 3.2 10*3/uL — ABNORMAL HIGH (ref 0.7–3.1)
Lymphs: 33 %
MCH: 31.7 pg (ref 26.6–33.0)
MCHC: 33.6 g/dL (ref 31.5–35.7)
MCV: 95 fL (ref 79–97)
Monocytes Absolute: 0.6 10*3/uL (ref 0.1–0.9)
Monocytes: 6 %
Neutrophils Absolute: 5.3 10*3/uL (ref 1.4–7.0)
Neutrophils: 56 %
Platelets: 291 10*3/uL (ref 150–450)
RBC: 4.38 x10E6/uL (ref 3.77–5.28)
RDW: 12.4 % (ref 11.7–15.4)
WBC: 9.6 10*3/uL (ref 3.4–10.8)

## 2020-11-15 LAB — COMPREHENSIVE METABOLIC PANEL
ALT: 10 IU/L (ref 0–32)
AST: 19 IU/L (ref 0–40)
Albumin/Globulin Ratio: 2 (ref 1.2–2.2)
Albumin: 4.3 g/dL (ref 3.8–4.9)
Alkaline Phosphatase: 64 IU/L (ref 44–121)
BUN/Creatinine Ratio: 23 (ref 9–23)
BUN: 16 mg/dL (ref 6–24)
Bilirubin Total: <0.2 mg/dL (ref 0.0–1.2)
CO2: 22 mmol/L (ref 20–29)
Calcium: 9.5 mg/dL (ref 8.7–10.2)
Chloride: 99 mmol/L (ref 96–106)
Creatinine, Ser: 0.71 mg/dL (ref 0.57–1.00)
Globulin, Total: 2.1 g/dL (ref 1.5–4.5)
Glucose: 89 mg/dL (ref 65–99)
Potassium: 4.3 mmol/L (ref 3.5–5.2)
Sodium: 135 mmol/L (ref 134–144)
Total Protein: 6.4 g/dL (ref 6.0–8.5)
eGFR: 102 mL/min/{1.73_m2} (ref >59)

## 2020-11-15 LAB — TSH: TSH: 2.38 u[IU]/mL (ref 0.450–4.500)

## 2020-11-16 ENCOUNTER — Telehealth: Payer: Self-pay | Admitting: *Deleted

## 2020-11-16 DIAGNOSIS — H5203 Hypermetropia, bilateral: Secondary | ICD-10-CM | POA: Diagnosis not present

## 2020-11-16 NOTE — Telephone Encounter (Signed)
Called FEP clinical line, spoke with Rylee, stated Bernita Raisin is non-formulary, not covered. The medication covered is nurtec. She'll fax over forms for nurtec PA and formulary exception for ubrelvy if MD still wants ubrelvy. Decision takes 3 business days once form is faxed back. Nurtec PA can be done over phone, but Ubrelvy PA cannot.

## 2020-11-21 NOTE — Telephone Encounter (Signed)
Received Bernita Raisin formulary exception PA form. Filled out, on MD desk for review, signature.

## 2020-11-21 NOTE — Telephone Encounter (Signed)
Fredric Mare from Cover My Meds called in regards to a PA for medication Ubrogepant (UBRELVY) 100 MG TABS. Requesting a call back at [phone] use key [id number]

## 2020-11-21 NOTE — Telephone Encounter (Signed)
Called CMM, spoke with Maralyn Sago and advised her I called FEP , am waiting on fax for [place] PA. She advised cannot complete PA through [facility]. Called FEP, spoke with [place] and requested she fax Nurtec and [place] PA forms. She verbalized understanding, appreciation.

## 2020-11-22 ENCOUNTER — Telehealth: Payer: Self-pay | Admitting: *Deleted

## 2020-11-22 NOTE — Telephone Encounter (Signed)
Bernita Raisin PA form faxed with office note to [facility]. Received a receipt of confirmation.

## 2020-11-23 NOTE — Telephone Encounter (Signed)
I called [employer] and spoke with Raynelle Fanning to check CPT codes [phone] and 09233. Raynelle Fanning states no PA is required for [phone] if billed under the medical benefit (buy and bill). If specialty pharmacy, PA will need to be obtained through [facility] by calling [phone]. Raynelle Fanning states no PA is required for [address] in OP setting. Reference #[id number].

## 2020-11-27 NOTE — Telephone Encounter (Signed)
I called patient and offered her an appointment on 7/13 at 1:00 with Megan. Patient agreed. I advised her to sign up for the Botox Savings Program. Patient states she is already aware of it and has signed up.

## 2020-11-27 NOTE — Telephone Encounter (Signed)
Toma Copier RN faxed signed Bernita Raisin PA form on 11/22/20.

## 2020-11-28 NOTE — Telephone Encounter (Signed)
We received a denial for Ubrelvy.  Letter stated that member has not experienced a treatment failure, and intolerance to, and adverse reaction to, or contraindication to more more of the coverage options in the therapeutic class.  If appeal is requested by the patient, she has to do the appeal or designate representative, faxed to [phone] within 6 months.   Patient should be able to use the savings card. Updated patient via mychart.

## 2020-11-29 ENCOUNTER — Ambulatory Visit: Payer: Federal, State, Local not specified - PPO | Admitting: Adult Health

## 2020-11-29 ENCOUNTER — Other Ambulatory Visit: Payer: Self-pay | Admitting: Family Medicine

## 2020-11-29 DIAGNOSIS — N921 Excessive and frequent menstruation with irregular cycle: Secondary | ICD-10-CM

## 2020-11-29 DIAGNOSIS — R9389 Abnormal findings on diagnostic imaging of other specified body structures: Secondary | ICD-10-CM

## 2020-11-29 DIAGNOSIS — F5101 Primary insomnia: Secondary | ICD-10-CM

## 2020-11-29 DIAGNOSIS — G43709 Chronic migraine without aura, not intractable, without status migrainosus: Secondary | ICD-10-CM | POA: Diagnosis not present

## 2020-11-29 NOTE — Progress Notes (Signed)
Botox- 200 units x 1 vial Lot: [id number] Expiration: 05/2023 NDC: 0023-3921-02  Bacteriostatic 0.9% Sodium Chloride- 4mL total Lot:[id number] Expiration: 05/2022 NDC: 0409-1996-02  Dx: G43.709 BB 

## 2020-11-29 NOTE — Progress Notes (Signed)
       BOTOX PROCEDURE NOTE FOR MIGRAINE HEADACHE    Contraindications and precautions discussed with patient(above). Aseptic procedure was observed and patient tolerated procedure. Procedure performed by Butch Penny, NP  The condition has existed for more than 6 months, and pt does not have a diagnosis of ALS, Myasthenia Gravis or Lambert-Eaton Syndrome.  Risks and benefits of injections discussed and pt agrees to proceed with the procedure.  Written consent obtained  These injections are medically necessary.  This is her first injection cycle these injections do not cause sedations or hallucinations which the oral therapies may cause.  Indication/Diagnosis: chronic migraine BOTOX(J0585) injection was performed according to protocol by [employer]. 200 units of BOTOX was dissolved into 4 cc NS.   NDC: [address]-26  Type of toxin: Botox  Botox- 200 units x 1 vial Lot: [id number] Expiration: 05/2023 NDC: 9485-4627-03  Bacteriostatic 0.9% Sodium Chloride- 73mL total [id number] Expiration: 05/2022 NDC: 9937-1696-78  Dx: L38.101  Description of procedure:  The patient was placed in a sitting position. The standard protocol was used for Botox as follows, with 5 units of Botox injected at each site:   -Procerus muscle, midline injection  -Corrugator muscle, bilateral injection  -Frontalis muscle, bilateral injection, with 2 sites each side, medial injection was performed in the upper one third of the frontalis muscle, in the region vertical from the medial inferior edge of the superior orbital rim. The lateral injection was again in the upper one third of the forehead vertically above the lateral limbus of the cornea, 1.5 cm lateral to the medial injection site.  -Temporalis muscle injection, 4 sites, bilaterally. The first injection was 3 cm above the tragus of the ear, second injection site was 1.5 cm to 3 cm up from the first injection site in line with the tragus of the ear. The  third injection site was 1.5-3 cm forward between the first 2 injection sites. The fourth injection site was 1.5 cm posterior to the second injection site.  -Occipitalis muscle injection, 3 sites, bilaterally. The first injection was done one half way between the occipital protuberance and the tip of the mastoid process behind the ear. The second injection site was done lateral and superior to the first, 1 fingerbreadth from the first injection. The third injection site was 1 fingerbreadth superiorly and medially from the first injection site.  -Cervical paraspinal muscle injection, 2 sites, bilateral knee first injection site was 1 cm from the midline of the cervical spine, 3 cm inferior to the lower border of the occipital protuberance. The second injection site was 1.5 cm superiorly and laterally to the first injection site.  -Trapezius muscle injection was performed at 3 sites, bilaterally. The first injection site was in the upper trapezius muscle halfway between the inflection point of the neck, and the acromion. The second injection site was one half way between the acromion and the first injection site. The third injection was done between the first injection site and the inflection point of the neck.   Will return for repeat injection in 3 months.   200 units of Botox was used any Botox not use was wasted.  The patient tolerated the procedure well.  Butch Penny, MSN, NP-C 11/29/2020, 1:19 PM [facility] [address], [address] [place], [place] [address] [phone]

## 2021-02-20 ENCOUNTER — Telehealth: Payer: Federal, State, Local not specified - PPO | Admitting: Family Medicine

## 2021-02-20 ENCOUNTER — Encounter: Payer: Self-pay | Admitting: Family Medicine

## 2021-02-20 ENCOUNTER — Other Ambulatory Visit: Payer: Self-pay

## 2021-02-20 VITALS — Ht 64.0 in | Wt 140.0 lb

## 2021-02-20 DIAGNOSIS — J0110 Acute frontal sinusitis, unspecified: Secondary | ICD-10-CM

## 2021-02-20 DIAGNOSIS — F5101 Primary insomnia: Secondary | ICD-10-CM | POA: Diagnosis not present

## 2021-02-20 DIAGNOSIS — G43719 Chronic migraine without aura, intractable, without status migrainosus: Secondary | ICD-10-CM | POA: Diagnosis not present

## 2021-02-20 DIAGNOSIS — R9389 Abnormal findings on diagnostic imaging of other specified body structures: Secondary | ICD-10-CM

## 2021-02-20 DIAGNOSIS — B349 Viral infection, unspecified: Secondary | ICD-10-CM

## 2021-02-20 DIAGNOSIS — N921 Excessive and frequent menstruation with irregular cycle: Secondary | ICD-10-CM

## 2021-02-20 MED ORDER — IPRATROPIUM BROMIDE 0.06 % NA SOLN: 2.0000 | Freq: Four times a day (QID) | NASAL | 0 refills | Status: DC

## 2021-02-20 MED ORDER — TEMAZEPAM 15 MG PO CAPS: ORAL_CAPSULE | ORAL | 2 refills | Status: DC

## 2021-02-20 MED ORDER — BENZONATATE 100 MG PO CAPS: 100.0000 mg | ORAL_CAPSULE | Freq: Three times a day (TID) | ORAL | 0 refills | Status: DC | PRN

## 2021-02-20 MED ORDER — SUMATRIPTAN SUCCINATE 50 MG PO TABS: 50.0000 mg | ORAL_TABLET | Freq: Once | ORAL | 3 refills | Status: DC | PRN

## 2021-02-20 MED ORDER — PSEUDOEPH-BROMPHEN-DM 30-2-10 MG/5ML PO SYRP: 5.0000 mL | ORAL_SOLUTION | Freq: Four times a day (QID) | ORAL | 0 refills | Status: DC | PRN

## 2021-02-20 MED ORDER — PREDNISONE 10 MG PO TABS: ORAL_TABLET | ORAL | 0 refills | Status: DC

## 2021-02-20 NOTE — Progress Notes (Signed)
Subjective:    Patient ID: Sharon Ponce, female    DOB: 07-21-1970, 50 y.o.   MRN: [id number]  Tonisha Silvey is a 50 y.o. female presenting on 02/20/2021 for Sore Throat, Cough, and Nasal Congestion  Virtual / Telehealth Encounter - Video Visit via MyChart The purpose of this virtual visit is to provide medical care while limiting exposure to the novel coronavirus (COVID19) for both patient and office staff.  Consent was obtained for remote visit:  Yes.   Answered questions that patient had about telehealth interaction:  Yes.   I discussed the limitations, risks, security and privacy concerns of performing an evaluation and management service by video/telephone. I also discussed with the patient that there may be a patient responsible charge related to this service. The patient expressed understanding and agreed to proceed.  Patient Location: Home Provider Location: [facility] (Office)  Participants in virtual visit: - Patient: Sharon Ponce - CMA: Orinda Kenner, CMA - Provider: Dr Parks Ranger   HPI  Acute Viral Syndrome Reports symptoms started 4 days ago with sinus upper respiratory symptoms with sinus congestion, cough, sore throat, admits some nausea only with persistent coughing spell. Admits drainage and congestion has significant sinus pressure congestion impacting her ability to rest. OTC Theraflu and Cough Syrup with temporary relief She has been unable to work, she has plenty of sick leave time. She has not been running a fever. She has done COVID rapid tests x 3 in past 3-4 days, all negative results.  Migraines, chronic Followed by Neurologist [facility] Dr Jaynee Eagles Has been managed on Ubrelvy, maxalt, and then also on Botox therapy. Uses imitrex PRN that is helpful, needs new re order.  Insomnia, primary Chronic problem, needs refill on Temazepam nightly.  Health Maintenance: COVID Vaccination status is completed 1st 2 doses in series Pfizer 4 and  09/2019.  Depression screen [facility] 2/9 03/17/2020 07/09/2019 03/08/2019  Decreased Interest 1 1 0  Down, Depressed, Hopeless 0 0 0  PHQ - 2 Score 1 1 0  Altered sleeping 1 3 2   Tired, decreased energy 1 1 0  Change in appetite 0 0 0  Feeling bad or failure about yourself  0 0 0  Trouble concentrating 1 0 0  Moving slowly or fidgety/restless 0 0 0  Suicidal thoughts 0 0 0  PHQ-9 Score 4 5 2   Difficult doing work/chores Somewhat difficult Extremely dIfficult Not difficult at all    Social History   Tobacco Use   Smoking status: Some Days    Packs/day: 0.25    Years: 20.00    Pack years: 5.00    Types: Cigarettes   Smokeless tobacco: Never   Tobacco comments:    some days  Vaping Use   Vaping Use: Never used  Substance Use Topics   Alcohol use: Never   Drug use: Never    Review of Systems Per HPI unless specifically indicated above     Objective:    Ht 5' 4"  (1.626 m)   Wt 140 lb (63.5 kg)   BMI 24.03 kg/m   Wt Readings from Last 3 Encounters:  02/20/21 140 lb (63.5 kg)  11/14/20 156 lb (70.8 kg)  11/14/20 157 lb (71.2 kg)    Physical Exam  Note examination was completely remotely via video observation objective data only  Gen - well-appearing but currently ill and tired appearing, no acute distress or apparent pain, comfortable HEENT - eyes appear clear without discharge or redness Heart/Lungs - cannot examine  virtually - she had several coughing spells during visit, able to speak and not dyspneic  Abd - cannot examine virtually  Skin - face visible today- no rash Neuro - awake, alert, oriented Psych - not anxious appearing   Results for orders placed or performed in visit on 11/14/20  CBC with Differential/Platelets  Result Value Ref Range   WBC 9.6 3.4 - 10.8 x10E3/uL   RBC 4.38 3.77 - 5.28 x10E6/uL   Hemoglobin 13.9 11.1 - 15.9 g/dL   Hematocrit 41.4 34.0 - 46.6 %   MCV 95 79 - 97 fL   MCH 31.7 26.6 - 33.0 pg   MCHC 33.6 31.5 - 35.7 g/dL   RDW 12.4  11.7 - 15.4 %   Platelets 291 150 - 450 x10E3/uL   Neutrophils 56 Not Estab. %   Lymphs 33 Not Estab. %   Monocytes 6 Not Estab. %   Eos 4 Not Estab. %   Basos 1 Not Estab. %   Neutrophils Absolute 5.3 1.4 - 7.0 x10E3/uL   Lymphocytes Absolute 3.2 (H) 0.7 - 3.1 x10E3/uL   Monocytes Absolute 0.6 0.1 - 0.9 x10E3/uL   EOS (ABSOLUTE) 0.4 0.0 - 0.4 x10E3/uL   Basophils Absolute 0.1 0.0 - 0.2 x10E3/uL   Immature Granulocytes 0 Not Estab. %   Immature Grans (Abs) 0.0 0.0 - 0.1 x10E3/uL  Comprehensive metabolic panel  Result Value Ref Range   Glucose 89 65 - 99 mg/dL   BUN 16 6 - 24 mg/dL   Creatinine, Ser 0.71 0.57 - 1.00 mg/dL   eGFR 102 >59 mL/min/1.73   BUN/Creatinine Ratio 23 9 - 23   Sodium 135 134 - 144 mmol/L   Potassium 4.3 3.5 - 5.2 mmol/L   Chloride 99 96 - 106 mmol/L   CO2 22 20 - 29 mmol/L   Calcium 9.5 8.7 - 10.2 mg/dL   Total Protein 6.4 6.0 - 8.5 g/dL   Albumin 4.3 3.8 - 4.9 g/dL   Globulin, Total 2.1 1.5 - 4.5 g/dL   Albumin/Globulin Ratio 2.0 1.2 - 2.2   Bilirubin Total <0.2 0.0 - 1.2 mg/dL   Alkaline Phosphatase 64 44 - 121 IU/L   AST 19 0 - 40 IU/L   ALT 10 0 - 32 IU/L  TSH  Result Value Ref Range   TSH 2.380 0.450 - 4.500 uIU/mL      Assessment & Plan:   Problem List Items Addressed This Visit     Thickened endometrium   Relevant Medications   temazepam (RESTORIL) 15 MG capsule   Primary insomnia   Relevant Medications   temazepam (RESTORIL) 15 MG capsule   Menorrhagia with irregular cycle   Relevant Medications   temazepam (RESTORIL) 15 MG capsule   Intractable chronic migraine without aura and without status migrainosus   Relevant Medications   predniSONE (DELTASONE) 10 MG tablet   SUMAtriptan (IMITREX) 50 MG tablet   Other Visit Diagnoses     Acute viral syndrome    -  Primary   Relevant Medications   predniSONE (DELTASONE) 10 MG tablet   brompheniramine-pseudoephedrine-DM 30-2-10 MG/5ML syrup   benzonatate (TESSALON) 100 MG capsule    Acute non-recurrent frontal sinusitis       Relevant Medications   predniSONE (DELTASONE) 10 MG tablet   brompheniramine-pseudoephedrine-DM 30-2-10 MG/5ML syrup   benzonatate (TESSALON) 100 MG capsule   ipratropium (ATROVENT) 0.06 % nasal spray        Insomnia Menstrual irregularity Review PDMP Re order Temazepam 30 pills +  refills  Chronic Migraines Followed by [facility] Neurology Failed Roselyn Meier, maxalt and other med On Botox with improvement Will re order Imitrex PRN  Acute viral syndrome Acute sinusitis Has initial COVID vaccine primary series, no booster No known sick contact 4 days onset symptoms Viral suggestive syndrome COVID home rapid test negative x 3 in first 4 days Will treat with symptom management Prednisone taper, bromfed syrup Start Tessalon Perls take 1 capsule up to 3 times a day as needed for cough Start Atrovent nasal spray decongestant 2 sprays in each nostril up to 4 times daily for 7 days   Meds ordered this encounter  Medications   predniSONE (DELTASONE) 10 MG tablet    Sig: Take 6 tabs with breakfast Day 1, 5 tabs Day 2, 4 tabs Day 3, 3 tabs Day 4, 2 tabs Day 5, 1 tab Day 6.    Dispense:  21 tablet    Refill:  0   brompheniramine-pseudoephedrine-DM 30-2-10 MG/5ML syrup    Sig: Take 5 mLs by mouth 4 (four) times daily as needed.    Dispense:  118 mL    Refill:  0   benzonatate (TESSALON) 100 MG capsule    Sig: Take 1 capsule (100 mg total) by mouth 3 (three) times daily as needed for cough.    Dispense:  30 capsule    Refill:  0   ipratropium (ATROVENT) 0.06 % nasal spray    Sig: Place 2 sprays into both nostrils 4 (four) times daily. For up to 5-7 days then stop.    Dispense:  15 mL    Refill:  0   SUMAtriptan (IMITREX) 50 MG tablet    Sig: Take 1 tablet (50 mg total) by mouth once as needed for up to 1 dose for migraine. May repeat one dose in 2 hours if headache persists, for max dose 24 hours    Dispense:  12 tablet    Refill:  3    temazepam (RESTORIL) 15 MG capsule    Sig: TAKE 1 CAPSULE(15 MG) BY MOUTH AT BEDTIME AS NEEDED FOR SLEEP    Dispense:  30 capsule    Refill:  2      Follow up plan: Return if symptoms worsen or fail to improve.   Patient verbalizes understanding with the above medical recommendations including the limitation of remote medical advice.  Specific follow-up and call-back criteria were given for patient to follow-up or seek medical care more urgently if needed.  Total duration of direct patient care provided via video conference: 15 minutes   Nobie Putnam, [facility] 02/20/2021, 11:15 AM

## 2021-02-20 NOTE — Patient Instructions (Addendum)
   Please schedule a Follow-up Appointment to: Return if symptoms worsen or fail to improve.  If you have any other questions or concerns, please feel free to call the office or send a message through MyChart. You may also schedule an earlier appointment if necessary.  Additionally, you may be receiving a survey about your experience at our office within a few days to 1 week by e-mail or mail. We value your feedback.  Lia Vigilante, DO [facility], [facility] 

## 2021-02-22 ENCOUNTER — Ambulatory Visit: Payer: Federal, State, Local not specified - PPO | Admitting: Adult Health

## 2021-02-22 VITALS — Ht 64.0 in

## 2021-02-22 DIAGNOSIS — G43709 Chronic migraine without aura, not intractable, without status migrainosus: Secondary | ICD-10-CM

## 2021-02-22 NOTE — Progress Notes (Signed)
       BOTOX PROCEDURE NOTE FOR MIGRAINE HEADACHE    Contraindications and precautions discussed with patient(above). Aseptic procedure was observed and patient tolerated procedure. Procedure performed by Butch Penny, NP  The condition has existed for more than 6 months, and pt does not have a diagnosis of ALS, Myasthenia Gravis or Lambert-Eaton Syndrome.  Risks and benefits of injections discussed and pt agrees to proceed with the procedure.  Written consent obtained  These injections are medically necessary. She receives good benefits from these injections. These injections do not cause sedations or hallucinations which the oral therapies may cause.  Indication/Diagnosis: chronic migraine BOTOX(J0585) injection was performed according to protocol by [employer]. 200 units of BOTOX was dissolved into 4 cc NS.   NDC: [address]-60  Type of toxin: Botox  Botox- 200 units x 1 vial Lot: [id number] Expiration: 08/2023 NDC: 5732-2025-42  Bacteriostatic 0.9% Sodium Chloride- 12mL total Lot: [id number] Expiration: 06/2021 NDC: 28315-176-16  Dx: W73.710   Description of procedure:  The patient was placed in a sitting position. The standard protocol was used for Botox as follows, with 5 units of Botox injected at each site:   -Procerus muscle, midline injection  -Corrugator muscle, bilateral injection  -Frontalis muscle, bilateral injection, with 2 sites each side, medial injection was performed in the upper one third of the frontalis muscle, in the region vertical from the medial inferior edge of the superior orbital rim. The lateral injection was again in the upper one third of the forehead vertically above the lateral limbus of the cornea, 1.5 cm lateral to the medial injection site.  -Temporalis muscle injection, 4 sites, bilaterally. The first injection was 3 cm above the tragus of the ear, second injection site was 1.5 cm to 3 cm up from the first injection site in line with the  tragus of the ear. The third injection site was 1.5-3 cm forward between the first 2 injection sites. The fourth injection site was 1.5 cm posterior to the second injection site.  -Occipitalis muscle injection, 3 sites, bilaterally. The first injection was done one half way between the occipital protuberance and the tip of the mastoid process behind the ear. The second injection site was done lateral and superior to the first, 1 fingerbreadth from the first injection. The third injection site was 1 fingerbreadth superiorly and medially from the first injection site.  -Cervical paraspinal muscle injection, 2 sites, bilateral knee first injection site was 1 cm from the midline of the cervical spine, 3 cm inferior to the lower border of the occipital protuberance. The second injection site was 1.5 cm superiorly and laterally to the first injection site.  -Trapezius muscle injection was performed at 3 sites, bilaterally. The first injection site was in the upper trapezius muscle halfway between the inflection point of the neck, and the acromion. The second injection site was one half way between the acromion and the first injection site. The third injection was done between the first injection site and the inflection point of the neck.   Will return for repeat injection in 3 months.   A 200 units of Botox was used, 155 units were injected, the rest of the Botox was wasted. The patient tolerated the procedure well, there were no complications of the above procedure.  Butch Penny, MSN, NP-C 02/22/2021, 11:09 AM [facility] [address], [address] [place], [place] [address] [phone]

## 2021-02-22 NOTE — Progress Notes (Signed)
Botox- 200 units x 1 vial Lot: [id number] Expiration: 08/2023 NDC: 4373-5789-78  Bacteriostatic 0.9% Sodium Chloride- 49mL total Lot: [id number] Expiration: 06/2021 NDC: 20813-887-19  Dx: L97.471 B/B

## 2021-02-25 ENCOUNTER — Other Ambulatory Visit: Payer: Self-pay | Admitting: Family Medicine

## 2021-02-25 DIAGNOSIS — F5101 Primary insomnia: Secondary | ICD-10-CM

## 2021-02-25 DIAGNOSIS — R9389 Abnormal findings on diagnostic imaging of other specified body structures: Secondary | ICD-10-CM

## 2021-02-25 DIAGNOSIS — N921 Excessive and frequent menstruation with irregular cycle: Secondary | ICD-10-CM

## 2021-02-25 NOTE — Telephone Encounter (Signed)
Requested medication (s) are due for refill today: no  Requested medication (s) are on the active medication list: yes  Last refill:  02/20/21 #30 2 RF  Future visit scheduled: no  Notes to clinic:  med not delegated to NT to RF   Requested Prescriptions  Pending Prescriptions Disp Refills   temazepam (RESTORIL) 15 MG capsule [Pharmacy Med Name: TEMAZEPAM 15MG  CAPSULES] 30 capsule     Sig: TAKE 1 CAPSULE(15 MG) BY MOUTH AT BEDTIME AS NEEDED FOR SLEEP     Not Delegated - Psychiatry:  Anxiolytics/Hypnotics Failed - 02/25/2021  5:09 PM      Failed - This refill cannot be delegated      Failed - Urine Drug Screen completed in last 360 days      Passed - Valid encounter within last 6 months    Recent Outpatient Visits           5 days ago Acute viral syndrome   [facility], DO   6 months ago Abnormal uterine bleeding (AUB)   [facility], DO   9 months ago Anxiety   [facility], [facility], Jodelle Gross   11 months ago Primary insomnia   [facility], [facility], FNP   1 year ago Herpes zoster without complication   [facility], [place], DO

## 2021-02-27 ENCOUNTER — Telehealth: Payer: Self-pay

## 2021-02-27 DIAGNOSIS — E538 Deficiency of other specified B group vitamins: Secondary | ICD-10-CM

## 2021-02-27 DIAGNOSIS — E559 Vitamin D deficiency, unspecified: Secondary | ICD-10-CM

## 2021-02-27 NOTE — Telephone Encounter (Signed)
Copied from CRM [id number]. Topic: General - Other >> Feb 27, 2021 12:08 PM Jaquita Rector A wrote: Reason for CRM: Patient called in to inform Dr Kirtland Bouchard that she is done with all medication taken as prescribed but still feel really awful asking what else can she get the cough is still very bad and there is no energy at all in her body. Asking for a call back today please with instructions at Ph# [phone]

## 2021-02-27 NOTE — Telephone Encounter (Signed)
Called patient, she is better after steroid. She still has congestion URI sinus symptoms, she raises concern of energy fatigue chronic worse past 1 year after covid 04/2020, asks about long covid, will come in for B12 vitamin D testing then maybe refer to Pulm for Long COVID concerns in future if still bothering her  Saralyn Pilar, DO [facility] 02/27/2021, 6:30 PM

## 2021-03-01 ENCOUNTER — Ambulatory Visit: Payer: Federal, State, Local not specified - PPO | Admitting: Adult Health

## 2021-04-09 ENCOUNTER — Other Ambulatory Visit: Payer: Self-pay | Admitting: Family Medicine

## 2021-04-09 DIAGNOSIS — R9389 Abnormal findings on diagnostic imaging of other specified body structures: Secondary | ICD-10-CM

## 2021-04-09 DIAGNOSIS — G43719 Chronic migraine without aura, intractable, without status migrainosus: Secondary | ICD-10-CM

## 2021-04-09 DIAGNOSIS — N921 Excessive and frequent menstruation with irregular cycle: Secondary | ICD-10-CM

## 2021-04-09 DIAGNOSIS — F419 Anxiety disorder, unspecified: Secondary | ICD-10-CM

## 2021-04-09 NOTE — Telephone Encounter (Signed)
Requested medication (s) are due for refill today yes  Requested medication (s) are on the active medication list -yes  Future visit scheduled -no  Last refill: 12/22/20  Notes to clinic: Request RF: Rx prescribed by outside provider  Requested Prescriptions  Pending Prescriptions Disp Refills   venlafaxine XR (EFFEXOR-XR) 150 MG 24 hr capsule [Pharmacy Med Name: VENLAFAXINE 150MG  ER CAPSULES] 90 capsule     Sig: TAKE 1 CAPSULE(150 MG) BY MOUTH DAILY WITH BREAKFAST     Psychiatry: Antidepressants - SNRI - desvenlafaxine & venlafaxine Failed - 04/09/2021  7:31 AM      Failed - LDL in normal range and within 360 days    LDL Cholesterol (Calc)  Date Value Ref Range Status  07/23/2019 136 (H) mg/dL (calc) Final    Comment:    Reference range: <100 . Desirable range <100 mg/dL for primary prevention;   <70 mg/dL for patients with CHD or diabetic patients  with > or = 2 CHD risk factors. 09/22/2019 LDL-C is now calculated using the Martin-Hopkins  calculation, which is a validated novel method providing  better accuracy than the Friedewald equation in the  estimation of LDL-C.  Marland Kitchen et al. Horald Pollen. Lenox Ahr): 2061-2068  (http://education.[employer].com/faq/FAQ164)           Failed - Total Cholesterol in normal range and within 360 days    Cholesterol  Date Value Ref Range Status  07/23/2019 224 (H) <200 mg/dL Final          Failed - Triglycerides in normal range and within 360 days    Triglycerides  Date Value Ref Range Status  07/23/2019 48 <150 mg/dL Final          Passed - Last BP in normal range    BP Readings from Last 1 Encounters:  11/14/20 100/68          Passed - Valid encounter within last 6 months    Recent Outpatient Visits           1 month ago Acute viral syndrome   [facility] [place], [place], DO   7 months ago Abnormal uterine bleeding (AUB)   [facility], DO   10 months ago  Anxiety   [facility], [facility], FNP   1 year ago Primary insomnia   [facility], [facility], FNP   1 year ago Herpes zoster without complication   [facility] [place], [place], DO                 Requested Prescriptions  Pending Prescriptions Disp Refills   venlafaxine XR (EFFEXOR-XR) 150 MG 24 hr capsule [Pharmacy Med Name: VENLAFAXINE 150MG  ER CAPSULES] 90 capsule     Sig: TAKE 1 CAPSULE(150 MG) BY MOUTH DAILY WITH BREAKFAST     Psychiatry: Antidepressants - SNRI - desvenlafaxine & venlafaxine Failed - 04/09/2021  7:31 AM      Failed - LDL in normal range and within 360 days    LDL Cholesterol (Calc)  Date Value Ref Range Status  07/23/2019 136 (H) mg/dL (calc) Final    Comment:    Reference range: <100 . Desirable range <100 mg/dL for primary prevention;   <70 mg/dL for patients with CHD or diabetic patients  with > or = 2 CHD risk factors. 04/11/2021 LDL-C is now calculated using the Martin-Hopkins  calculation, which is a validated novel method providing  better accuracy than  the Friedewald equation in the  estimation of LDL-C.  Horald Pollen et al. Lenox Ahr. 6073;710(62): 2061-2068  (http://education.[employer].com/faq/FAQ164)           Failed - Total Cholesterol in normal range and within 360 days    Cholesterol  Date Value Ref Range Status  07/23/2019 224 (H) <200 mg/dL Final          Failed - Triglycerides in normal range and within 360 days    Triglycerides  Date Value Ref Range Status  07/23/2019 48 <150 mg/dL Final          Passed - Last BP in normal range    BP Readings from Last 1 Encounters:  11/14/20 100/68          Passed - Valid encounter within last 6 months    Recent Outpatient Visits           1 month ago Acute viral syndrome   [facility] [place], [name], DO   7 months ago Abnormal uterine bleeding (AUB)   [facility]  Smitty Cords, DO   10 months ago Anxiety   [facility], Jodelle Gross, FNP   1 year ago Primary insomnia   [facility], [place], FNP   1 year ago Herpes zoster without complication   [facility] [place], [name], DO

## 2021-05-20 ENCOUNTER — Other Ambulatory Visit: Payer: Self-pay | Admitting: Family Medicine

## 2021-05-20 DIAGNOSIS — R9389 Abnormal findings on diagnostic imaging of other specified body structures: Secondary | ICD-10-CM

## 2021-05-20 DIAGNOSIS — N921 Excessive and frequent menstruation with irregular cycle: Secondary | ICD-10-CM

## 2021-05-20 DIAGNOSIS — F5101 Primary insomnia: Secondary | ICD-10-CM

## 2021-05-22 ENCOUNTER — Encounter: Payer: Self-pay | Admitting: Family Medicine

## 2021-05-22 ENCOUNTER — Other Ambulatory Visit: Payer: Self-pay

## 2021-05-22 ENCOUNTER — Ambulatory Visit: Payer: Federal, State, Local not specified - PPO | Admitting: Adult Health

## 2021-05-22 ENCOUNTER — Ambulatory Visit: Payer: Federal, State, Local not specified - PPO | Admitting: Family Medicine

## 2021-05-22 VITALS — BP 136/69 | HR 68 | Ht 64.0 in | Wt 164.8 lb

## 2021-05-22 DIAGNOSIS — G43709 Chronic migraine without aura, not intractable, without status migrainosus: Secondary | ICD-10-CM | POA: Diagnosis not present

## 2021-05-22 DIAGNOSIS — F5101 Primary insomnia: Secondary | ICD-10-CM | POA: Diagnosis not present

## 2021-05-22 DIAGNOSIS — F419 Anxiety disorder, unspecified: Secondary | ICD-10-CM

## 2021-05-22 MED ORDER — TEMAZEPAM 15 MG PO CAPS: ORAL_CAPSULE | ORAL | 5 refills | Status: DC

## 2021-05-22 NOTE — Assessment & Plan Note (Signed)
Chronic stable insomnia.  Continues to take temazepam without any side effects and regularly takes this approximately 5 nights per week.    Has had treatment failure with Vernon Prey, sonata, doxepin.  Temazepam is the only medication that has been effective without side effects or withdrawal symptoms.  PDMP reviewed and appropriate.  Plan: 1. Continue temazepam 15mg  QHS PRN.  Can take 5-7 nights per week for insomnia 2. Continue working on sleep hygiene 3. RTC in 6 months

## 2021-05-22 NOTE — Patient Instructions (Addendum)
Thank you for coming to the office today.  Refill sent for Temazepam 30 day with +5 refills.  Keep on Flonase therapy to help post nasal drip drainage.   Please schedule a Follow-up Appointment to: Return if symptoms worsen or fail to improve.  If you have any other questions or concerns, please feel free to call the office or send a message through MyChart. You may also schedule an earlier appointment if necessary.  Additionally, you may be receiving a survey about your experience at our office within a few days to 1 week by e-mail or mail. We value your feedback.  Saralyn Pilar, DO [facility], [place]

## 2021-05-22 NOTE — Progress Notes (Signed)
Botox- 200 units x 1 vial °Lot: [id number] °Expiration: 01/2024 °NDC: 0023-3921-02 °  °Bacteriostatic 0.9% Sodium Chloride- 4mL total °Lot: [id number] °Expiration: 12/19/2022 °NDC: 0409-1966-02 °  °Dx: G43.709  °B/B °

## 2021-05-22 NOTE — Progress Notes (Signed)
Subjective:    Patient ID: Sharon Ponce, female    DOB: 26-Apr-1971, 51 y.o.   MRN: [id number]  Sharon Ponce is a 51 y.o. female presenting on 05/22/2021 for Insomnia and Anxiety   HPI  Post-Viral Post nasal drip Since last virtual visit 02/20/21 she has felt better overall Has lingering post nasal drip with occasional cough.  Migraines, chronic Followed by Neurologist [facility] Dr Jaynee Eagles Has been managed on Ubrelvy, maxalt, and then also on Botox therapy. Uses imitrex PRN that is helpful   Insomnia, primary Chronic problem, needs refill on Temazepam nightly. She is tracking sleep as well and improves when takes medication, can still fall back asleep. Needs refills. Improving exercise   Depression screen [facility] 2/9 05/22/2021 03/17/2020 07/09/2019  Decreased Interest 0 1 1  Down, Depressed, Hopeless 0 0 0  PHQ - 2 Score 0 1 1  Altered sleeping 2 1 3   Tired, decreased energy 1 1 1   Change in appetite 1 0 0  Feeling bad or failure about yourself  0 0 0  Trouble concentrating 1 1 0  Moving slowly or fidgety/restless 0 0 0  Suicidal thoughts 0 0 0  PHQ-9 Score 5 4 5   Difficult doing work/chores Not difficult at all Somewhat difficult Extremely dIfficult    Social History   Tobacco Use   Smoking status: Some Days    Packs/day: 0.25    Years: 20.00    Pack years: 5.00    Types: Cigarettes   Smokeless tobacco: Never   Tobacco comments:    some days  Vaping Use   Vaping Use: Never used  Substance Use Topics   Alcohol use: Never   Drug use: Never    Review of Systems Per HPI unless specifically indicated above     Objective:    BP 136/69    Pulse 68    Ht 5' 4"  (1.626 m)    Wt 164 lb 12.8 oz (74.8 kg)    SpO2 97%    BMI 28.29 kg/m   Wt Readings from Last 3 Encounters:  05/22/21 164 lb 12.8 oz (74.8 kg)  02/20/21 140 lb (63.5 kg)  11/14/20 156 lb (70.8 kg)    Physical Exam Vitals and nursing note reviewed.  Constitutional:      General: She is not in acute distress.     Appearance: Normal appearance. She is well-developed. She is not diaphoretic.     Comments: Well-appearing, comfortable, cooperative  HENT:     Head: Normocephalic and atraumatic.  Eyes:     General:        Right eye: No discharge.        Left eye: No discharge.     Conjunctiva/sclera: Conjunctivae normal.  Cardiovascular:     Rate and Rhythm: Normal rate.  Pulmonary:     Effort: Pulmonary effort is normal.  Skin:    General: Skin is warm and dry.     Findings: No erythema or rash.  Neurological:     Mental Status: She is alert and oriented to person, place, and time.  Psychiatric:        Mood and Affect: Mood normal.        Behavior: Behavior normal.        Thought Content: Thought content normal.     Comments: Well groomed, good eye contact, normal speech and thoughts   Results for orders placed or performed in visit on 11/14/20  CBC with Differential/Platelets  Result Value Ref Range  WBC 9.6 3.4 - 10.8 x10E3/uL   RBC 4.38 3.77 - 5.28 x10E6/uL   Hemoglobin 13.9 11.1 - 15.9 g/dL   Hematocrit 41.4 34.0 - 46.6 %   MCV 95 79 - 97 fL   MCH 31.7 26.6 - 33.0 pg   MCHC 33.6 31.5 - 35.7 g/dL   RDW 12.4 11.7 - 15.4 %   Platelets 291 150 - 450 x10E3/uL   Neutrophils 56 Not Estab. %   Lymphs 33 Not Estab. %   Monocytes 6 Not Estab. %   Eos 4 Not Estab. %   Basos 1 Not Estab. %   Neutrophils Absolute 5.3 1.4 - 7.0 x10E3/uL   Lymphocytes Absolute 3.2 (H) 0.7 - 3.1 x10E3/uL   Monocytes Absolute 0.6 0.1 - 0.9 x10E3/uL   EOS (ABSOLUTE) 0.4 0.0 - 0.4 x10E3/uL   Basophils Absolute 0.1 0.0 - 0.2 x10E3/uL   Immature Granulocytes 0 Not Estab. %   Immature Grans (Abs) 0.0 0.0 - 0.1 x10E3/uL  Comprehensive metabolic panel  Result Value Ref Range   Glucose 89 65 - 99 mg/dL   BUN 16 6 - 24 mg/dL   Creatinine, Ser 0.71 0.57 - 1.00 mg/dL   eGFR 102 >59 mL/min/1.73   BUN/Creatinine Ratio 23 9 - 23   Sodium 135 134 - 144 mmol/L   Potassium 4.3 3.5 - 5.2 mmol/L   Chloride 99 96 - 106  mmol/L   CO2 22 20 - 29 mmol/L   Calcium 9.5 8.7 - 10.2 mg/dL   Total Protein 6.4 6.0 - 8.5 g/dL   Albumin 4.3 3.8 - 4.9 g/dL   Globulin, Total 2.1 1.5 - 4.5 g/dL   Albumin/Globulin Ratio 2.0 1.2 - 2.2   Bilirubin Total <0.2 0.0 - 1.2 mg/dL   Alkaline Phosphatase 64 44 - 121 IU/L   AST 19 0 - 40 IU/L   ALT 10 0 - 32 IU/L  TSH  Result Value Ref Range   TSH 2.380 0.450 - 4.500 uIU/mL      Assessment & Plan:   Problem List Items Addressed This Visit     Primary insomnia - Primary    Chronic stable insomnia.  Continues to take temazepam without any side effects and regularly takes this approximately 5 nights per week.    Has had treatment failure with [facility], sonata, doxepin.  Temazepam is the only medication that has been effective without side effects or withdrawal symptoms.  PDMP reviewed and appropriate.  Plan: 1. Continue temazepam 85m QHS PRN.  Can take 5-7 nights per week for insomnia 2. Continue working on sleep hygiene 3. RTC in 6 months      Relevant Medications   temazepam (RESTORIL) 15 MG capsule   Anxiety    Meds ordered this encounter  Medications   temazepam (RESTORIL) 15 MG capsule    Sig: TAKE 1 CAPSULE(15 MG) BY MOUTH AT BEDTIME AS NEEDED FOR SLEEP    Dispense:  30 capsule    Refill:  5      Follow up plan: Return if symptoms worsen or fail to improve.  ANobie Putnam [facility] 05/22/2021, 3:43 PM

## 2021-05-22 NOTE — Progress Notes (Signed)
05/22/20: reports Botox is working well. No headaches in the last two months. Gets Cosmetic Botox through office in [place].    BOTOX PROCEDURE NOTE FOR MIGRAINE HEADACHE    Contraindications and precautions discussed with patient(above). Aseptic procedure was observed and patient tolerated procedure. Procedure performed by Butch Penny, NP  The condition has existed for more than 6 months, and pt does not have a diagnosis of ALS, Myasthenia Gravis or Lambert-Eaton Syndrome.  Risks and benefits of injections discussed and pt agrees to proceed with the procedure.  Written consent obtained  These injections are medically necessary. She receives good benefits from these injections. These injections do not cause sedations or hallucinations which the oral therapies may cause.  Indication/Diagnosis: chronic migraine BOTOX(J0585) injection was performed according to protocol by [employer]. 200 units of BOTOX was dissolved into 4 cc NS.   NDC: [address]-67  Type of toxin: Botox  Botox- 200 units x 1 vial Lot: [id number] Expiration: 01/2024 NDC: 5102-5852-77   Bacteriostatic 0.9% Sodium Chloride- 49mL total Lot: [id number] Expiration: 12/19/2022 NDC: 3614-4315-40   Dx: G86.761    Description of procedure:  The patient was placed in a sitting position. The standard protocol was used for Botox as follows, with 5 units of Botox injected at each site:   -Procerus muscle, midline injection  -Corrugator muscle, bilateral injection  -Frontalis muscle, bilateral injection, with 2 sites each side, medial injection was performed in the upper one third of the frontalis muscle, in the region vertical from the medial inferior edge of the superior orbital rim. The lateral injection was again in the upper one third of the forehead vertically above the lateral limbus of the cornea, 1.5 cm lateral to the medial injection site.  -Temporalis muscle injection, 4 sites, bilaterally. The first injection  was 3 cm above the tragus of the ear, second injection site was 1.5 cm to 3 cm up from the first injection site in line with the tragus of the ear. The third injection site was 1.5-3 cm forward between the first 2 injection sites. The fourth injection site was 1.5 cm posterior to the second injection site.  -Occipitalis muscle injection, 3 sites, bilaterally. The first injection was done one half way between the occipital protuberance and the tip of the mastoid process behind the ear. The second injection site was done lateral and superior to the first, 1 fingerbreadth from the first injection. The third injection site was 1 fingerbreadth superiorly and medially from the first injection site.  -Cervical paraspinal muscle injection, 2 sites, bilateral knee first injection site was 1 cm from the midline of the cervical spine, 3 cm inferior to the lower border of the occipital protuberance. The second injection site was 1.5 cm superiorly and laterally to the first injection site.  -Trapezius muscle injection was performed at 3 sites, bilaterally. The first injection site was in the upper trapezius muscle halfway between the inflection point of the neck, and the acromion. The second injection site was one half way between the acromion and the first injection site. The third injection was done between the first injection site and the inflection point of the neck.   Will return for repeat injection in 3 months.   A 200 units of Botox was used, 155 units were injected, the rest of the Botox was wasted. The patient tolerated the procedure well, there were no complications of the above procedure.  Butch Penny, MSN, NP-C 05/22/2021, 9:41 AM [facility] [address]  [place], [place] [address] [phone]

## 2021-07-04 ENCOUNTER — Other Ambulatory Visit: Payer: Self-pay

## 2021-07-04 DIAGNOSIS — G43719 Chronic migraine without aura, intractable, without status migrainosus: Secondary | ICD-10-CM

## 2021-07-04 DIAGNOSIS — F419 Anxiety disorder, unspecified: Secondary | ICD-10-CM

## 2021-07-04 DIAGNOSIS — N921 Excessive and frequent menstruation with irregular cycle: Secondary | ICD-10-CM

## 2021-07-04 DIAGNOSIS — R9389 Abnormal findings on diagnostic imaging of other specified body structures: Secondary | ICD-10-CM

## 2021-07-04 MED ORDER — VENLAFAXINE HCL ER 150 MG PO CP24: 150.0000 mg | ORAL_CAPSULE | Freq: Every day | ORAL | 1 refills | Status: DC

## 2021-08-13 ENCOUNTER — Telehealth: Payer: Self-pay | Admitting: Adult Health

## 2021-08-13 NOTE — Telephone Encounter (Signed)
Called pt lm about needing to get appt moved to a later time, due to provider having a schedule conflict. ?

## 2021-08-14 ENCOUNTER — Ambulatory Visit: Payer: Federal, State, Local not specified - PPO | Admitting: Adult Health

## 2021-09-03 ENCOUNTER — Ambulatory Visit: Payer: Federal, State, Local not specified - PPO | Admitting: Adult Health

## 2021-09-17 ENCOUNTER — Encounter: Payer: Self-pay | Admitting: Family Medicine

## 2021-09-17 DIAGNOSIS — F5101 Primary insomnia: Secondary | ICD-10-CM

## 2021-09-17 MED ORDER — TEMAZEPAM 15 MG PO CAPS: 15.0000 mg | ORAL_CAPSULE | Freq: Every evening | ORAL | 5 refills | Status: DC | PRN

## 2021-10-09 ENCOUNTER — Encounter: Payer: Self-pay | Admitting: Neurology

## 2021-10-09 ENCOUNTER — Ambulatory Visit: Payer: Federal, State, Local not specified - PPO | Admitting: Neurology

## 2021-10-09 VITALS — BP 113/72 | HR 61 | Ht 64.0 in | Wt 172.0 lb

## 2021-10-09 DIAGNOSIS — R6889 Other general symptoms and signs: Secondary | ICD-10-CM

## 2021-10-09 DIAGNOSIS — F439 Reaction to severe stress, unspecified: Secondary | ICD-10-CM

## 2021-10-09 DIAGNOSIS — G43709 Chronic migraine without aura, not intractable, without status migrainosus: Secondary | ICD-10-CM | POA: Diagnosis not present

## 2021-10-09 NOTE — Progress Notes (Signed)
[facility]    Provider:  Dr Lucia Gaskins Requesting Provider: Saralyn Pilar * Primary Care Provider:  Smitty Cords, DO  CC:  migraines  Interval history: had 2 botox injections last 05/2021. The botox was trmendously helping but had to miss last injection and now the migraines are worsening. After the second injection, she had incredible improvement one month had only 2 migraines and only in bad light, light is a big trigger, outside light, driving, sumatriptan or rizatriptan works well acutely. Stress and light are triggers. She had an emergency, missed last appointment, baseline was She has 15 migraine days a month lasts > 12 hours ongoing for over a year. After the second botox maybe 2 a month. She needs a letter for accomodations for light as a trigger for her migraines.   Patient complains of symptoms per HPI as well as the following symptoms: light sensitivity . Pertinent negatives and positives per HPI. All others negative   HPI:  Sharon Ponce is a 51 y.o. female here as requested by Saralyn Pilar * for migraines. PMHx chronic migraine,anxiety.   Patient is here alone and reports that migraine started in 2018 and became worse when the pandemic started in 2020, she did not have them when she was younger so she thinks they may be menopausal, she can have migraines half the month, Imitrex works sometimes but she can wake up 3 AM with a migraine and Imitrex as marked for that, she had cosmetic Botox pre-COVID and later realized it was helping the migraines.  She is tried Imitrex, over-the-counter nasal spray, Effexor, Advil, Excedrin, Tylenol. She noticed improvement with cosmetic botox and had to stop during pandemic and noticed the severity worsening. Start on tleft, pulsating, pounding. Throbbing, taking imitrex very early may help, radiates to behind the eye, extreme light sensitivity, sound sensitivity, nausea, movement makes it worse, a dark quiet  room or dark sunglasses make it better, heat can be a trigger, getting worse with changes perimenopausal. They can last all day or days, out of a month. Can radiate to the neck. She has 15 migraine days a month lasts > 12 hours ongoing for over a year. No aura, no medication overuse. No other focal neurologic deficits, associated symptoms, inciting events or modifiable factors.No vision changes. Not exertional or positional. No new quality.   Reviewed notes, labs and imaging from outside physicians, which showed:  I reviewed Dr. Althea Charon notes, migraines are a chronic problem, ongoing since May 2020 and more recently in October 2021, she has had Botox injections cosmetically, and good results with infrequent migraines,propranolol is contraindicated because of hypotention, gabapentin.  From a review of records, medications tried that can be used in migraine management include: Imitrex, over-the-counter nasal spray, Effexor, Advil, Excedrin, Tylenol, Decadron injections, Zofran injections, sertraline, tramadol,amitriptyline, maxalt, zolmitriptan  Review of Systems: Patient complains of symptoms per HPI as well as the following symptoms headaches. Pertinent negatives and positives per HPI. All others negative.   Social History   Socioeconomic History   Marital status: Single    Spouse name: Not on file   Number of children: Not on file   Years of education: Not on file   Highest education level: Master's degree (e.g., MA, MS, MEng, MEd, MSW, MBA)  Occupational History   Occupation: Agricultural engineer: [employer]  Tobacco Use   Smoking status: Some Days    Packs/day: 0.25    Years: 20.00    Pack years: 5.00    Types:  Cigarettes   Smokeless tobacco: Never   Tobacco comments:    some days  Vaping Use   Vaping Use: Never used  Substance and Sexual Activity   Alcohol use: Never   Drug use: Never   Sexual activity: Not Currently    Birth control/protection: None  Other Topics Concern    Not on file  Social History Narrative   Lives alone    Right handed   Caffeine: approx 6 cups/day   Social Determinants of Health   Financial Resource Strain: Not on file  Food Insecurity: Not on file  Transportation Needs: Not on file  Physical Activity: Not on file  Stress: Not on file  Social Connections: Not on file  Intimate Partner Violence: Not on file    Family History  Problem Relation Age of Onset   Healthy Mother    Healthy Father    Migraines Father    Leukemia Maternal Grandmother    Heart disease Maternal Grandfather    Vision loss Paternal Aunt    Migraines Maternal Aunt    Breast cancer Neg Hx    Colon cancer Neg Hx    Stroke Neg Hx    Ovarian cancer Neg Hx     Past Medical History:  Diagnosis Date   Allergy    Anxiety    Migraine     Patient Active Problem List   Diagnosis Date Noted   Light sensitivity 10/09/2021   Chronic migraine without aura without status migrainosus, not intractable 11/14/2020   Menorrhagia with irregular cycle 10/31/2020   Thickened endometrium 10/31/2020   Abnormal uterine bleeding (AUB) 08/28/2020   Intractable chronic migraine without aura and without status migrainosus 08/28/2020   Colon cancer screening 03/17/2020   Routine general medical examination at health care facility 07/09/2019   Primary insomnia 05/04/2018   Anxiety 05/04/2018    Past Surgical History:  Procedure Laterality Date   BUNIONECTOMY     DILITATION & CURRETTAGE/HYSTROSCOPY WITH NOVASURE ABLATION N/A 10/31/2020   Procedure: DILATATION & CURETTAGE/HYSTEROSCOPY WITH NOVASURE ABLATION;  Surgeon: Conard Novak, MD;  Location: [facility] ORS;  Service: Gynecology;  Laterality: N/A;   PLACEMENT OF BREAST IMPLANTS  2005   TOOTH EXTRACTION  2012    Current Outpatient Medications  Medication Sig Dispense Refill   acidophilus (RISAQUAD) CAPS capsule Take 1 capsule by mouth daily.     B Complex-C (SUPER B COMPLEX PO) Take 1 tablet by mouth daily.      CRANBERRY PO Take 1 tablet by mouth daily.     ELDERBERRY PO Take 1 capsule by mouth daily.     fluticasone (FLONASE) 50 MCG/ACT nasal spray Place 2 sprays into both nostrils daily.     melatonin 5 MG TABS Take 5 mg by mouth at bedtime.     Multiple Vitamin (MULTIVITAMIN) tablet Take 1 tablet by mouth daily.     Nutritional Supplements (DHEA PO) Take 1 capsule by mouth daily.     Omega-3 Fatty Acids (OMEGA-3 FISH OIL PO) Take 1 capsule by mouth daily.     SUMAtriptan (IMITREX) 50 MG tablet Take 1 tablet (50 mg total) by mouth once as needed for up to 1 dose for migraine. May repeat one dose in 2 hours if headache persists, for max dose 24 hours 12 tablet 3   temazepam (RESTORIL) 15 MG capsule Take 1-2 capsules (15-30 mg total) by mouth at bedtime as needed for sleep. 60 capsule 5   venlafaxine XR (EFFEXOR-XR) 150 MG 24 hr capsule  Take 1 capsule (150 mg total) by mouth daily with breakfast. 90 capsule 1   ipratropium (ATROVENT) 0.06 % nasal spray Place 2 sprays into both nostrils 4 (four) times daily. For up to 5-7 days then stop. 15 mL 0   No current facility-administered medications for this visit.    Allergies as of 10/09/2021 - Review Complete 10/09/2021  Allergen Reaction Noted   Codeine Nausea And Vomiting 01/23/2018   Dust mite extract  07/09/2019   Mold extract [trichophyton]  07/09/2019   Tree extract  07/09/2019    Vitals: BP 113/72   Pulse 61   Ht 5\' 4"  (1.626 m)   Wt 172 lb (78 kg)   BMI 29.52 kg/m  Last Weight:  Wt Readings from Last 1 Encounters:  10/09/21 172 lb (78 kg)   Last Height:   Ht Readings from Last 1 Encounters:  10/09/21 5\' 4"  (1.626 m)    Exam: NAD, pleasant                  Speech:    Speech is normal; fluent and spontaneous with normal comprehension.  Cognition:    The patient is oriented to person, place, and time;     recent and remote memory intact;     language fluent;    Cranial Nerves:    The pupils are equal, round, and reactive to  light.Trigeminal sensation is intact and the muscles of mastication are normal. The face is symmetric. The palate elevates in the midline. Hearing intact. Voice is normal. Shoulder shrug is normal. The tongue has normal motion without fasciculations.   Coordination:  No dysmetria  Motor Observation:    No asymmetry, no atrophy, and no involuntary movements noted. Tone:    Normal muscle tone.     Strength:    Strength is V/V in the upper and lower limbs.      Sensation: intact to LT    Assessment/Plan:  Patient with chronic migraines, no aura, no medication overuse, here for follow up with light sensitivity. Doing well on Botox for migraines, missed last appointment with complete procedure today as well as appointment.  Filters for computer and glasses Glass FL-41 filter: example is Somnilight Fl-41 Light Sensitivity Glasses for Florescent Light Sensitivity, Photophobia, and Migraines Also get extra tinting on car  Allay Lamp - Soothing Narrow-Band Burna MortimerGreen Light Avoid lights that trigger including fluorescent lighting   Cc: Jackolyn ConferKaramalegos, Alexander *,  Karamalegos, Netta NeatAlexander J, DO  Naomie DeanAntonia Raffaele Derise, MD  [facility] [address] [place], [place] [address]  Phone [phone] Fax [phone]  I spent 30 minutes of face-to-face and non-face-to-face time with patient on the  1. Light sensitivity   2. Stress    diagnosis.  This included previsit chart review, lab review, study review, order entry, electronic health record documentation, patient education on the different diagnostic and therapeutic options, counseling and coordination of care, risks and benefits of management, compliance, or risk factor reduction. This does not include time spent on botox procedure.

## 2021-10-09 NOTE — Progress Notes (Signed)
Consent Form Botulism Toxin Injection For Chronic Migraine  10/09/2021: Patient with significant improvement on Botox, missed her last appointment, greater than 70 % improvement in migraine frequency and severity.  Reviewed orally with patient, additionally signature is on file:  Botulism toxin has been approved by the Federal drug administration for treatment of chronic migraine. Botulism toxin does not cure chronic migraine and it may not be effective in some patients.  The administration of botulism toxin is accomplished by injecting a small amount of toxin into the muscles of the neck and head. Dosage must be titrated for each individual. Any benefits resulting from botulism toxin tend to wear off after 3 months with a repeat injection required if benefit is to be maintained. Injections are usually done every 3-4 months with maximum effect peak achieved by about 2 or 3 weeks. Botulism toxin is expensive and you should be sure of what costs you will incur resulting from the injection.  The side effects of botulism toxin use for chronic migraine may include:   -Transient, and usually mild, facial weakness with facial injections  -Transient, and usually mild, head or neck weakness with head/neck injections  -Reduction or loss of forehead facial animation due to forehead muscle weakness  -Eyelid drooping  -Dry eye  -Pain at the site of injection or bruising at the site of injection  -Double vision  -Potential unknown long term risks  Contraindications: You should not have Botox if you are pregnant, nursing, allergic to albumin, have an infection, skin condition, or muscle weakness at the site of the injection, or have myasthenia gravis, Lambert-Eaton syndrome, or ALS.  It is also possible that as with any injection, there may be an allergic reaction or no effect from the medication. Reduced effectiveness after repeated injections is sometimes seen and rarely infection at the injection site may  occur. All care will be taken to prevent these side effects. If therapy is given over a long time, atrophy and wasting in the muscle injected may occur. Occasionally the patient's become refractory to treatment because they develop antibodies to the toxin. In this event, therapy needs to be modified.  I have read the above information and consent to the administration of botulism toxin.    BOTOX PROCEDURE NOTE FOR MIGRAINE HEADACHE    Contraindications and precautions discussed with patient(above). Aseptic procedure was observed and patient tolerated procedure. Procedure performed by Dr. Artemio Aly  The condition has existed for more than 6 months, and pt does not have a diagnosis of ALS, Myasthenia Gravis or Lambert-Eaton Syndrome.  Risks and benefits of injections discussed and pt agrees to proceed with the procedure.  Written consent obtained  These injections are medically necessary. Pt  receives good benefits from these injections. These injections do not cause sedations or hallucinations which the oral therapies may cause.  Description of procedure:  The patient was placed in a sitting position. The standard protocol was used for Botox as follows, with 5 units of Botox injected at each site:   -Procerus muscle, midline injection  -Corrugator muscle, bilateral injection  -Frontalis muscle, bilateral injection, with 2 sites each side, medial injection was performed in the upper one third of the frontalis muscle, in the region vertical from the medial inferior edge of the superior orbital rim. The lateral injection was again in the upper one third of the forehead vertically above the lateral limbus of the cornea, 1.5 cm lateral to the medial injection site.  -Temporalis muscle injection, 4 sites, bilaterally.  The first injection was 3 cm above the tragus of the ear, second injection site was 1.5 cm to 3 cm up from the first injection site in line with the tragus of the ear. The third  injection site was 1.5-3 cm forward between the first 2 injection sites. The fourth injection site was 1.5 cm posterior to the second injection site.   -Occipitalis muscle injection, 3 sites, bilaterally. The first injection was done one half way between the occipital protuberance and the tip of the mastoid process behind the ear. The second injection site was done lateral and superior to the first, 1 fingerbreadth from the first injection. The third injection site was 1 fingerbreadth superiorly and medially from the first injection site.  -Cervical paraspinal muscle injection, 2 sites, bilateral knee first injection site was 1 cm from the midline of the cervical spine, 3 cm inferior to the lower border of the occipital protuberance. The second injection site was 1.5 cm superiorly and laterally to the first injection site.  -Trapezius muscle injection was performed at 3 sites, bilaterally. The first injection site was in the upper trapezius muscle halfway between the inflection point of the neck, and the acromion. The second injection site was one half way between the acromion and the first injection site. The third injection was done between the first injection site and the inflection point of the neck.   Will return for repeat injection in 3 months.   155 units of Botox was used, 45u Botox not injected was wasted. The patient tolerated the procedure well, there were no complications of the above procedure.

## 2021-10-09 NOTE — Telephone Encounter (Signed)
Printed. Patient will pay fee at checkout.

## 2021-10-09 NOTE — Patient Instructions (Addendum)
Filters for computer and glasses Glass FL-41 filter: example is Somnilight Fl-41 Light Sensitivity Glasses for Florescent Light Sensitivity, Photophobia, and Migraines Also get extra tinting on car  Allay Lamp - Soothing Narrow-Band Green Light Avoid lights that trigger including fluorescent lighting

## 2021-10-09 NOTE — Progress Notes (Signed)
Botox- 200 units x 1 vial Lot: [id number] Expiration: 04/2024 NDC: 2876-8115-72  Bacteriostatic 0.9% Sodium Chloride- 12mL total Lot: [id number] Expiration: 12/19/2022 NDC: 6203-5597-41  Dx: [id number] B/B

## 2021-10-16 ENCOUNTER — Ambulatory Visit: Payer: Self-pay | Admitting: *Deleted

## 2021-10-16 ENCOUNTER — Ambulatory Visit: Payer: Federal, State, Local not specified - PPO | Admitting: Family Medicine

## 2021-10-16 NOTE — Telephone Encounter (Signed)
Pt called in about mild painful rash she is having on neck and chest and itching. She is looking for advice,no appt until June 2 with Dr Kirtland Bouchard. Please call back.  Pharmacy is [id number] - [place], [place] - [address] AT [facility] [address] [facility]  Phone: [phone]  Fax: [phone]    Left VM to call back.

## 2021-10-16 NOTE — Telephone Encounter (Signed)
Apt 10/17/2021 at 4pm   Thanks,   -Mickel Baas

## 2021-10-16 NOTE — Telephone Encounter (Signed)
    Chief Complaint: Rash to neck and chest. Red raised bumps that are painful. Has not eaten anything new or used new products. Symptoms: Pain, mild itching Frequency: Today Pertinent Negatives: Patient denies fever Disposition: [] ED /[] Urgent Care (no appt availability in office) / [] Appointment(In office/virtual)/ []  [place] [facility]/ [] Home Care/ [] Refused Recommended Disposition /[] [place] [facility]/ [x]  Follow-up with PCP Additional Notes: Pt. Going out of town 10/18/21. No availability before then. Asking to be worked in. Please advise pt.  Answer Assessment - Initial Assessment Questions 1. APPEARANCE of RASH: "Describe the rash."      Red, raised bumps, pea-sized 2. LOCATION: "Where is the rash located?"      Neck and cheat 3. NUMBER: "How many spots are there?"      11 bumps and increased 4. SIZE: "How big are the spots?" (Inches, centimeters or compare to size of a coin)      Pea-sized 5. ONSET: "When did the rash start?"      Today 6. ITCHING: "Does the rash itch?" If Yes, ask: "How bad is the itch?"  (Scale 0-10; or none, mild, moderate, severe)     Mild 7. PAIN: "Does the rash hurt?" If Yes, ask: "How bad is the pain?"  (Scale 0-10; or none, mild, moderate, severe)    - NONE (0): no pain    - MILD (1-3): doesn't interfere with normal activities     - MODERATE (4-7): interferes with normal activities or awakens from sleep     - SEVERE (8-10): excruciating pain, unable to do any normal activities     Moderate 8. OTHER SYMPTOMS: "Do you have any other symptoms?" (e.g., fever)     No 9. PREGNANCY: "Is there any chance you are pregnant?" "When was your last menstrual period?"     No  Protocols used: Rash or Redness - Localized-A-AH

## 2021-10-16 NOTE — Telephone Encounter (Signed)
Urgent care today or Double book work in tomorrow 5/31 at 4pm  Saralyn Pilar, DO [facility] 10/16/2021, 4:55 PM

## 2021-10-17 ENCOUNTER — Ambulatory Visit: Payer: Federal, State, Local not specified - PPO | Admitting: Family Medicine

## 2021-10-24 ENCOUNTER — Encounter: Payer: Self-pay | Admitting: *Deleted

## 2021-10-24 NOTE — Telephone Encounter (Signed)
Letter emailed to pt @ [email or web address]

## 2021-10-31 DIAGNOSIS — L570 Actinic keratosis: Secondary | ICD-10-CM | POA: Diagnosis not present

## 2021-10-31 DIAGNOSIS — Z79899 Other long term (current) drug therapy: Secondary | ICD-10-CM | POA: Diagnosis not present

## 2021-10-31 DIAGNOSIS — L648 Other androgenic alopecia: Secondary | ICD-10-CM | POA: Diagnosis not present

## 2021-10-31 DIAGNOSIS — L821 Other seborrheic keratosis: Secondary | ICD-10-CM | POA: Diagnosis not present

## 2021-11-19 ENCOUNTER — Telehealth: Payer: Self-pay | Admitting: Neurology

## 2021-11-19 DIAGNOSIS — G43709 Chronic migraine without aura, not intractable, without status migrainosus: Secondary | ICD-10-CM

## 2021-11-22 NOTE — Telephone Encounter (Signed)
LMVM for pt is this auto refill, and if she still needs this or using sumatriptan at this time.  She is to let us know.

## 2021-11-26 ENCOUNTER — Ambulatory Visit: Payer: Federal, State, Local not specified - PPO | Admitting: Adult Health

## 2021-11-26 MED ORDER — RIZATRIPTAN BENZOATE 10 MG PO TBDP: 10.0000 mg | ORAL_TABLET | ORAL | 11 refills | Status: DC | PRN

## 2021-11-26 NOTE — Telephone Encounter (Signed)
I spoke to pt and she states rizatriptan does work better.  She did get sumatriptan from her pcp last 02-2021 as she was in and needed refilled.  I see noted that Dr. Lucia Gaskins stated sumatriptan or rizatriptan is what pt has used.  I will go ahead and refill has pt has had previously. Pt will pay out of pocket if needs to (ODT works so much better).

## 2021-11-26 NOTE — Telephone Encounter (Signed)
Pt is calling to get a refill onRizatriptan Benzoate 10 MG. Pt would like a return call about this refill.

## 2021-11-26 NOTE — Addendum Note (Signed)
Addended by: Guy Begin on: 11/26/2021 04:40 PM   Modules accepted: Orders

## 2021-11-28 ENCOUNTER — Encounter: Payer: Self-pay | Admitting: Family Medicine

## 2021-11-28 ENCOUNTER — Ambulatory Visit: Payer: Federal, State, Local not specified - PPO | Admitting: Family Medicine

## 2021-11-28 VITALS — BP 107/71 | HR 72 | Ht 63.5 in | Wt 173.0 lb

## 2021-11-28 DIAGNOSIS — E669 Obesity, unspecified: Secondary | ICD-10-CM

## 2021-11-28 DIAGNOSIS — E66811 Obesity, class 1: Secondary | ICD-10-CM

## 2021-11-28 MED ORDER — SAXENDA 18 MG/3ML ~~LOC~~ SOPN: PEN_INJECTOR | SUBCUTANEOUS | 0 refills | Status: DC

## 2021-11-28 MED ORDER — INSULIN PEN NEEDLE 32G X 6 MM MISC: 3 refills | Status: DC

## 2021-11-28 NOTE — Patient Instructions (Addendum)
Thank you for coming to the office today.  Call insurance find cost and coverage of the following - check the following: - Drug Tier, Preferred List, On Formulary - All will require a "Prior Authorization" from [place] first, before you can find out the cost - Find out if there is "Step Therapy" (other medicines required before you can try these)  Once you pick the one you want to try, let me know - we can get a sample ready IF we have it in stock. Then try it - and before running out of medicine, contact me back to order your Rx so we have time to get it processed.  For Weight Loss / Obesity only  Wegovy (out of stock) (same as Ozempic) weekly injection - start 0.25mg  weekly, 1 dose per pen, single use, auto-injector  2. Saxenda - DAILY injection - start 0.6mg  injection DAILY, you can increase the dose by 1 notch or 0.6 mg per week, if you don't tolerate a dose, can reduce it the next day.  3. Contrave - oral medication, appetite suppression has wellbutrin/bupropion and naltrexone in it and it can also help with appetite, it is ordered through a speciality pharmacy.  Future make sure your insurance has weight loss coverage   Please schedule a Follow-up Appointment to: Return in about 3 months (around 02/28/2022) for 3 month follow-up Weight Management.  If you have any other questions or concerns, please feel free to call the office or send a message through MyChart. You may also schedule an earlier appointment if necessary.  Additionally, you may be receiving a survey about your experience at our office within a few days to 1 week by e-mail or mail. We value your feedback.  Saralyn Pilar, DO [facility], [place]

## 2021-11-28 NOTE — Progress Notes (Signed)
Subjective:    Patient ID: Sharon Ponce, female    DOB: Oct 13, 1970, 51 y.o.   MRN: [id number]  Sharon Ponce is a 51 y.o. female presenting on 11/28/2021 for Obesity   HPI  Obesity BMI >30 Abnormal Weight Gain  Admits in life she has never been more than 10 lbs overweight ever. She has always done low carb / low calorie diet in the past with success  She has been eating well still. She does not take in much sugar or soda, candy, sweets.  Weight trend since 2019, with weight initially 130 lbs up to approx 171 lbs now 4 years later.  Admits if she adheres to low carb diet, she will strongly crave sweets and that helps.  She has tried low calorie diet now, and eats fruits. She lowered to 800 calorie per day and felt starving.  Exercise is limited, she has been jogging and walking. But difficult with attempted jogging again feels like carrying too much weight.  She has a reasonable accommodation for work with her migraines. She doesn't go anywhere, she feels body image issues.       11/28/2021    3:17 PM 05/22/2021    3:31 PM 03/17/2020   11:46 AM  Depression screen PHQ 2/9  Decreased Interest 2 0 1  Down, Depressed, Hopeless 1 0 0  PHQ - 2 Score 3 0 1  Altered sleeping 0 2 1  Tired, decreased energy 1 1 1   Change in appetite 0 1 0  Feeling bad or failure about yourself  1 0 0  Trouble concentrating 2 1 1   Moving slowly or fidgety/restless 1 0 0  Suicidal thoughts 0 0 0  PHQ-9 Score 8 5 4   Difficult doing work/chores Somewhat difficult Not difficult at all Somewhat difficult    Social History   Tobacco Use   Smoking status: Some Days    Packs/day: 0.25    Years: 20.00    Total pack years: 5.00    Types: Cigarettes   Smokeless tobacco: Never   Tobacco comments:    some days  Vaping Use   Vaping Use: Never used  Substance Use Topics   Alcohol use: Never   Drug use: Never    Review of Systems Per HPI unless specifically indicated above     Objective:    BP  107/71   Pulse 72   Ht 5' 3.5" (1.613 m)   Wt 173 lb (78.5 kg)   SpO2 97%   BMI 30.16 kg/m   Wt Readings from Last 3 Encounters:  11/28/21 173 lb (78.5 kg)  10/09/21 172 lb (78 kg)  05/22/21 164 lb 12.8 oz (74.8 kg)    Physical Exam Vitals and nursing note reviewed.  Constitutional:      General: She is not in acute distress.    Appearance: She is well-developed. She is obese. She is not diaphoretic.     Comments: Well-appearing, comfortable, cooperative  HENT:     Head: Normocephalic and atraumatic.  Eyes:     General:        Right eye: No discharge.        Left eye: No discharge.     Conjunctiva/sclera: Conjunctivae normal.  Neck:     Thyroid: No thyromegaly.  Cardiovascular:     Rate and Rhythm: Normal rate and regular rhythm.     Heart sounds: Normal heart sounds. No murmur heard. Pulmonary:     Effort: Pulmonary effort is normal. No respiratory distress.  Breath sounds: Normal breath sounds. No wheezing or rales.  Musculoskeletal:        General: Normal range of motion.     Cervical back: Normal range of motion and neck supple.  Lymphadenopathy:     Cervical: No cervical adenopathy.  Skin:    General: Skin is warm and dry.     Findings: No erythema or rash.  Neurological:     Mental Status: She is alert and oriented to person, place, and time.  Psychiatric:        Behavior: Behavior normal.     Comments: Well groomed, good eye contact, normal speech and thoughts      Results for orders placed or performed in visit on 11/14/20  CBC with Differential/Platelets  Result Value Ref Range   WBC 9.6 3.4 - 10.8 x10E3/uL   RBC 4.38 3.77 - 5.28 x10E6/uL   Hemoglobin 13.9 11.1 - 15.9 g/dL   Hematocrit 41.4 34.0 - 46.6 %   MCV 95 79 - 97 fL   MCH 31.7 26.6 - 33.0 pg   MCHC 33.6 31.5 - 35.7 g/dL   RDW 12.4 11.7 - 15.4 %   Platelets 291 150 - 450 x10E3/uL   Neutrophils 56 Not Estab. %   Lymphs 33 Not Estab. %   Monocytes 6 Not Estab. %   Eos 4 Not Estab. %    Basos 1 Not Estab. %   Neutrophils Absolute 5.3 1.4 - 7.0 x10E3/uL   Lymphocytes Absolute 3.2 (H) 0.7 - 3.1 x10E3/uL   Monocytes Absolute 0.6 0.1 - 0.9 x10E3/uL   EOS (ABSOLUTE) 0.4 0.0 - 0.4 x10E3/uL   Basophils Absolute 0.1 0.0 - 0.2 x10E3/uL   Immature Granulocytes 0 Not Estab. %   Immature Grans (Abs) 0.0 0.0 - 0.1 x10E3/uL  Comprehensive metabolic panel  Result Value Ref Range   Glucose 89 65 - 99 mg/dL   BUN 16 6 - 24 mg/dL   Creatinine, Ser 0.71 0.57 - 1.00 mg/dL   eGFR 102 >59 mL/min/1.73   BUN/Creatinine Ratio 23 9 - 23   Sodium 135 134 - 144 mmol/L   Potassium 4.3 3.5 - 5.2 mmol/L   Chloride 99 96 - 106 mmol/L   CO2 22 20 - 29 mmol/L   Calcium 9.5 8.7 - 10.2 mg/dL   Total Protein 6.4 6.0 - 8.5 g/dL   Albumin 4.3 3.8 - 4.9 g/dL   Globulin, Total 2.1 1.5 - 4.5 g/dL   Albumin/Globulin Ratio 2.0 1.2 - 2.2   Bilirubin Total <0.2 0.0 - 1.2 mg/dL   Alkaline Phosphatase 64 44 - 121 IU/L   AST 19 0 - 40 IU/L   ALT 10 0 - 32 IU/L  TSH  Result Value Ref Range   TSH 2.380 0.450 - 4.500 uIU/mL      Assessment & Plan:   Problem List Items Addressed This Visit   None Visit Diagnoses     Obesity (BMI 30.0-34.9)    -  Primary   Relevant Medications   SAXENDA 18 MG/3ML SOPN   Insulin Pen Needle 32G X 6 MM MISC       Obesity BMI >30 Wt gain abnormal over past 3+ years with 40 lbs Start Saxenda dosing as advised for GLP1 therapy, benefit risk side effect reviewed. Goal wt loss >4% body weight in 3 months Encourage continued lifestyle modification as well with medication  Meds ordered this encounter  Medications   SAXENDA 18 MG/3ML SOPN    Sig: Injection 0.6 mg  into skin once daily for 1 week, as tolerated increase by increment of 0.40m every 1 week, max dose is 320minjection daily after 5 weeks.    Dispense:  15 mL    Refill:  0   Insulin Pen Needle 32G X 6 MM MISC    Sig: Use to inject Saxenda into skin daily.    Dispense:  90 each    Refill:  3      Follow up  plan: Return in about 3 months (around 02/28/2022) for 3 month follow-up Weight Management.   AlNobie PutnamDO [facility] 11/28/2021, 3:40 PM

## 2021-12-05 ENCOUNTER — Encounter: Payer: Self-pay | Admitting: Family Medicine

## 2021-12-19 ENCOUNTER — Other Ambulatory Visit: Payer: Self-pay | Admitting: Family Medicine

## 2021-12-19 DIAGNOSIS — E669 Obesity, unspecified: Secondary | ICD-10-CM

## 2021-12-20 NOTE — Telephone Encounter (Signed)
Requested Prescriptions  Pending Prescriptions Disp Refills  . SAXENDA 18 MG/3ML SOPN [Pharmacy Med Name: SAXENDA 6MG /ML PEN INJ 3ML] 15 mL 0    Sig: INJECT 0.6 MG UNDER THE SKIN DAILY FOR 1 WEEK AS TOLERATE INCREASE BY INCREMENTS OF 0.6 MG EVERY 1 WEEK. MAX DOSE 3 MG DAILY AFTER 5 WEEKS     Endocrinology:  Diabetes - GLP-1 Receptor Agonists Failed - 12/19/2021  7:27 PM      Failed - HBA1C is between 0 and 7.9 and within 180 days    No results found for: "HGBA1C", "LABA1C"       Passed - Valid encounter within last 6 months    Recent Outpatient Visits          3 weeks ago Obesity (BMI 30.0-34.9)   [facility], DO   7 months ago Primary insomnia   [facility], DO   10 months ago Acute viral syndrome   [facility] [place], [place], [place]   1 year ago Abnormal uterine bleeding (AUB)   [facility], DO   1 year ago Anxiety   [facility], [facility], FNP      Future Appointments            In 2 months Jodelle Gross, Althea Charon, DO [place], [facility]

## 2021-12-20 NOTE — Telephone Encounter (Signed)
reordered today 12/20/21  Requested Prescriptions  Refused Prescriptions Disp Refills  . SAXENDA 18 MG/3ML SOPN [Pharmacy Med Name: SAXENDA 6MG /ML PEN INJ 3ML] 15 mL 0    Sig: INJECT 0.6 MG UNDER THE SKIN DAILY FOR 1 WEEK AS TOLERATE INCREASE BY INCREMENTS OF 0.6 MG EVERY 1 WEEK. MAX DOSE 3 MG DAILY AFTER 5 WEEKS     Endocrinology:  Diabetes - GLP-1 Receptor Agonists Failed - 12/19/2021  7:28 PM      Failed - HBA1C is between 0 and 7.9 and within 180 days    No results found for: "HGBA1C", "LABA1C"       Passed - Valid encounter within last 6 months    Recent Outpatient Visits          3 weeks ago Obesity (BMI 30.0-34.9)   [facility], DO   7 months ago Primary insomnia   [facility], DO   10 months ago Acute viral syndrome   [facility] [place], [place]   1 year ago Abnormal uterine bleeding (AUB)   [facility], DO   1 year ago Anxiety   [facility] [place], [facility], FNP      Future Appointments            In 2 months Jodelle Gross, Althea Charon, DO [facility], [facility]

## 2021-12-25 ENCOUNTER — Other Ambulatory Visit: Payer: Self-pay | Admitting: Family Medicine

## 2021-12-25 DIAGNOSIS — F419 Anxiety disorder, unspecified: Secondary | ICD-10-CM

## 2021-12-25 DIAGNOSIS — R9389 Abnormal findings on diagnostic imaging of other specified body structures: Secondary | ICD-10-CM

## 2021-12-25 DIAGNOSIS — N921 Excessive and frequent menstruation with irregular cycle: Secondary | ICD-10-CM

## 2021-12-25 DIAGNOSIS — G43719 Chronic migraine without aura, intractable, without status migrainosus: Secondary | ICD-10-CM

## 2021-12-26 NOTE — Telephone Encounter (Signed)
Requested medication (s) are due for refill today -yes  Requested medication (s) are on the active medication list -yes  Future visit scheduled -yes  Last refill: 07/04/21 #90 1RF  Notes to clinic: fails lab protocol- over 1 year  Requested Prescriptions  Pending Prescriptions Disp Refills   venlafaxine XR (EFFEXOR-XR) 150 MG 24 hr capsule [Pharmacy Med Name: VENLAFAXINE ER 150MG  CAPSULES] 90 capsule 1    Sig: TAKE 1 CAPSULE(150 MG) BY MOUTH DAILY WITH BREAKFAST     Psychiatry: Antidepressants - SNRI - desvenlafaxine & venlafaxine Failed - 12/25/2021  6:25 PM      Failed - Cr in normal range and within 360 days    Creat  Date Value Ref Range Status  07/23/2019 0.66 0.50 - 1.05 mg/dL Final    Comment:    For patients >1 years of age, the reference limit for Creatinine is approximately 13% higher for people identified as African-American. .    Creatinine, Ser  Date Value Ref Range Status  11/14/2020 0.71 0.57 - 1.00 mg/dL Final         Failed - Lipid Panel in normal range within the last 12 months    Cholesterol  Date Value Ref Range Status  07/23/2019 224 (H) <200 mg/dL Final   LDL Cholesterol (Calc)  Date Value Ref Range Status  07/23/2019 136 (H) mg/dL (calc) Final    Comment:    Reference range: <100 . Desirable range <100 mg/dL for primary prevention;   <70 mg/dL for patients with CHD or diabetic patients  with > or = 2 CHD risk factors. 09/22/2019 LDL-C is now calculated using the Martin-Hopkins  calculation, which is a validated novel method providing  better accuracy than the Friedewald equation in the  estimation of LDL-C.  Marland Kitchen et al. Horald Pollen. Lenox Ahr): 2061-2068  (http://education.[facility].com/faq/FAQ164)    HDL  Date Value Ref Range Status  07/23/2019 73 > OR = 50 mg/dL Final   Triglycerides  Date Value Ref Range Status  07/23/2019 48 <150 mg/dL Final         Passed - Last BP in normal range    BP Readings from Last 1 Encounters:   11/28/21 107/71         Passed - Valid encounter within last 6 months    Recent Outpatient Visits           4 weeks ago Obesity (BMI 30.0-34.9)   [facility], Althea Charon, DO   7 months ago Primary insomnia   [facility], DO   10 months ago Acute viral syndrome   [facility] [place], [place], DO   1 year ago Abnormal uterine bleeding (AUB)   [facility], DO   1 year ago Anxiety   [facility], [facility], FNP       Future Appointments             In 2 months Jodelle Gross, Althea Charon, DO [facility], [facility]               Requested Prescriptions  Pending Prescriptions Disp Refills   venlafaxine XR (EFFEXOR-XR) 150 MG 24 hr capsule [Pharmacy Med Name: VENLAFAXINE ER 150MG  CAPSULES] 90 capsule 1    Sig: TAKE 1 CAPSULE(150 MG) BY MOUTH DAILY WITH BREAKFAST     Psychiatry: Antidepressants - SNRI - desvenlafaxine & venlafaxine Failed - 12/25/2021  6:25 PM  Failed - Cr in normal range and within 360 days    Creat  Date Value Ref Range Status  07/23/2019 0.66 0.50 - 1.05 mg/dL Final    Comment:    For patients >77 years of age, the reference limit for Creatinine is approximately 13% higher for people identified as African-American. .    Creatinine, Ser  Date Value Ref Range Status  11/14/2020 0.71 0.57 - 1.00 mg/dL Final         Failed - Lipid Panel in normal range within the last 12 months    Cholesterol  Date Value Ref Range Status  07/23/2019 224 (H) <200 mg/dL Final   LDL Cholesterol (Calc)  Date Value Ref Range Status  07/23/2019 136 (H) mg/dL (calc) Final    Comment:    Reference range: <100 . Desirable range <100 mg/dL for primary prevention;   <70 mg/dL for patients with CHD or diabetic patients  with > or = 2 CHD risk factors. Marland Kitchen LDL-C is now calculated using the Martin-Hopkins   calculation, which is a validated novel method providing  better accuracy than the Friedewald equation in the  estimation of LDL-C.  Horald Pollen et al. Lenox Ahr. 1950;932(67): 2061-2068  (http://education.[facility].com/faq/FAQ164)    HDL  Date Value Ref Range Status  07/23/2019 73 > OR = 50 mg/dL Final   Triglycerides  Date Value Ref Range Status  07/23/2019 48 <150 mg/dL Final         Passed - Last BP in normal range    BP Readings from Last 1 Encounters:  11/28/21 107/71         Passed - Valid encounter within last 6 months    Recent Outpatient Visits           4 weeks ago Obesity (BMI 30.0-34.9)   [facility] S[place] C[place], DO   7 months ago Primary insomnia   [facility] Smit[facility] Cords, DO   10 months ago Acute viral syndrome   [facility] [place], [name], DO   1 year ago Abnormal uterine bleeding (AUB)   [facility] Smitty Cords, DO   1 year ago Anxiety   [facility], Jodelle Gross, FNP       Future Appointments             In 2 months Althea Charon, [name], DO [facility], [facility]

## 2021-12-31 ENCOUNTER — Ambulatory Visit: Payer: Federal, State, Local not specified - PPO | Admitting: Adult Health

## 2021-12-31 DIAGNOSIS — G43709 Chronic migraine without aura, not intractable, without status migrainosus: Secondary | ICD-10-CM

## 2021-12-31 MED ORDER — ONABOTULINUMTOXINA 200 UNITS IJ SOLR
155.0000 [IU] | Freq: Once | INTRAMUSCULAR | Status: AC
  Administered 2021-12-31: 155 [IU] via INTRAMUSCULAR

## 2021-12-31 NOTE — Progress Notes (Signed)
12/31/21: botox continues to work well for her migraines. She was a little late getting botox this time which means she has had more headches.    BOTOX PROCEDURE NOTE FOR MIGRAINE HEADACHE    Contraindications and precautions discussed with patient(above). Aseptic procedure was observed and patient tolerated procedure. Procedure performed by Butch Penny, NP  The condition has existed for more than 6 months, and pt does not have a diagnosis of ALS, Myasthenia Gravis or Lambert-Eaton Syndrome.  Risks and benefits of injections discussed and pt agrees to proceed with the procedure.  Written consent obtained  These injections are medically necessary.  These injections do not cause sedations or hallucinations which the oral therapies may cause.  Indication/Diagnosis: chronic migraine BOTOX(J0585) injection was performed according to protocol by [employer]. 200 units of BOTOX was dissolved into 4 cc NS.   NDC: [address]-94  Type of toxin: Botox  Botox- 200 units x 1 vial Lot: [id number] Expiration: 02/206 NDC: 3500-9381-82   Bacteriostatic 0.9% Sodium Chloride- 49mL total Lot: [id number] Expiration: 12/19/2022 NDC: 9937-1696-78   Dx: L38.101  B/B   Description of procedure:  The patient was placed in a sitting position. The standard protocol was used for Botox as follows, with 5 units of Botox injected at each site:   -Procerus muscle, midline injection  -Corrugator muscle, bilateral injection  -Frontalis muscle, bilateral injection, with 2 sites each side, medial injection was performed in the upper one third of the frontalis muscle, in the region vertical from the medial inferior edge of the superior orbital rim. The lateral injection was again in the upper one third of the forehead vertically above the lateral limbus of the cornea, 1.5 cm lateral to the medial injection site.  -Temporalis muscle injection, 4 sites, bilaterally. The first injection was 3 cm above the tragus of  the ear, second injection site was 1.5 cm to 3 cm up from the first injection site in line with the tragus of the ear. The third injection site was 1.5-3 cm forward between the first 2 injection sites. The fourth injection site was 1.5 cm posterior to the second injection site.  -Occipitalis muscle injection, 3 sites, bilaterally. The first injection was done one half way between the occipital protuberance and the tip of the mastoid process behind the ear. The second injection site was done lateral and superior to the first, 1 fingerbreadth from the first injection. The third injection site was 1 fingerbreadth superiorly and medially from the first injection site.  -Cervical paraspinal muscle injection, 2 sites, bilateral knee first injection site was 1 cm from the midline of the cervical spine, 3 cm inferior to the lower border of the occipital protuberance. The second injection site was 1.5 cm superiorly and laterally to the first injection site.  -Trapezius muscle injection was performed at 3 sites, bilaterally. The first injection site was in the upper trapezius muscle halfway between the inflection point of the neck, and the acromion. The second injection site was one half way between the acromion and the first injection site. The third injection was done between the first injection site and the inflection point of the neck.   Will return for repeat injection in 3 months.   A 200 unit sof Botox was used, 155 units were injected, the rest of the Botox was wasted. The patient tolerated the procedure well, there were no complications of the above procedure.  Butch Penny, MSN, NP-C 12/31/2021, 8:11 AM [facility] [address]  [place], [place] [address] [phone]

## 2021-12-31 NOTE — Progress Notes (Signed)
Botox- 200 units x 1 vial Lot: [id number] Expiration: 02/206 NDC: 6153-7943-27  Bacteriostatic 0.9% Sodium Chloride- 66mL total Lot: [id number] Expiration: 12/19/2022 NDC: 6147-0929-57  Dx: M73.403  B/B

## 2022-01-01 ENCOUNTER — Other Ambulatory Visit: Payer: Self-pay | Admitting: Family Medicine

## 2022-01-01 DIAGNOSIS — G43719 Chronic migraine without aura, intractable, without status migrainosus: Secondary | ICD-10-CM

## 2022-01-02 NOTE — Telephone Encounter (Signed)
Requested medications are due for refill today.  Unsure  Requested medications are on the active medications list.  no  Last refill. 02/20/2021 #12 3 refills  Future visit scheduled.   yes  Notes to clinic.  Medication was d/c'd 11/26/2021    Requested Prescriptions  Pending Prescriptions Disp Refills   SUMAtriptan (IMITREX) 50 MG tablet [Pharmacy Med Name: SUMATRIPTAN 50MG  TABLETS] 12 tablet 3    Sig: TAKE 1 TABLET BY MOUTH AS NEEDED FOR MIGRAINE MAY REPEAT 1 DOSE IN 2 HOURS IF HEADACHE PERSISTS. 2 TABLET IN 24 HRS MAX DOSE     Neurology:  Migraine Therapy - Triptan Passed - 01/01/2022  6:56 PM      Passed - Last BP in normal range    BP Readings from Last 1 Encounters:  11/28/21 107/71         Passed - Valid encounter within last 12 months    Recent Outpatient Visits           1 month ago Obesity (BMI 30.0-34.9)   [facility], DO   7 months ago Primary insomnia   [facility], DO   10 months ago Acute viral syndrome   [facility] [place], DO   1 year ago Abnormal uterine bleeding (AUB)   [facility], DO   1 year ago Anxiety   [facility], FNP       Future Appointments             In 1 month Jodelle Gross, Althea Charon, DO [facility], [facility]

## 2022-01-07 ENCOUNTER — Telehealth: Payer: Self-pay | Admitting: Family Medicine

## 2022-01-07 DIAGNOSIS — E669 Obesity, unspecified: Secondary | ICD-10-CM

## 2022-01-07 MED ORDER — SAXENDA 18 MG/3ML ~~LOC~~ SOPN: 1.8000 mg | PEN_INJECTOR | Freq: Every day | SUBCUTANEOUS | 0 refills | Status: DC

## 2022-01-07 NOTE — Telephone Encounter (Signed)
Medication Refill - Medication: SAXENDA 18 MG/3ML SOPN  Pt stated her regular pharmacy is out of stock of medication SAXENDA 18 MG/3ML SOPN. Pt stated she found a pharmacy [facility] that has the medication however, they only sell by the box not the pen. Pt stated she is out of medication.   Pt is currently at 1.8 stated has been very tolerant of it. Is having no issues and really likes medication.   New Rx is needed explaining this is the only way to get in a box not the pen. Pharmacy stated they don't do partial prescriptions.   Please advise.    Has the patient contacted their pharmacy? Yes.    (Agent: If yes, when and what did the pharmacy advise?)  Preferred Pharmacy (with phone number or street name):  [facility] [address]), [place] - [address]  [address]) [place] [address]  Phone: [phone] Fax: [phone]  Hours: Not open 24 hours   Has the patient been seen for an appointment in the last year OR does the patient have an upcoming appointment? Yes.    Agent: Please be advised that RX refills may take up to 3 business days. We ask that you follow-up with your pharmacy.

## 2022-01-07 NOTE — Telephone Encounter (Signed)
Ordered Saxenda 1.8mg  dailiy to [employer]

## 2022-02-06 ENCOUNTER — Ambulatory Visit: Payer: Federal, State, Local not specified - PPO | Admitting: Family Medicine

## 2022-02-06 ENCOUNTER — Encounter: Payer: Self-pay | Admitting: Family Medicine

## 2022-02-06 VITALS — BP 104/62 | HR 79 | Ht 63.5 in | Wt 163.6 lb

## 2022-02-06 DIAGNOSIS — E669 Obesity, unspecified: Secondary | ICD-10-CM

## 2022-02-06 MED ORDER — SAXENDA 18 MG/3ML ~~LOC~~ SOPN: 3.0000 mg | PEN_INJECTOR | Freq: Every day | SUBCUTANEOUS | 2 refills | Status: DC

## 2022-02-06 NOTE — Progress Notes (Unsigned)
Subjective:    Patient ID: Sharon Ponce, female    DOB: 27-Jul-1970, 51 y.o.   MRN: [id number]  Sharon Ponce is a 51 y.o. female presenting on 02/06/2022 for Weight Check   HPI  Obesity BMI >30 Abnormal Weight Gain  Significant weight loss 10 lbs in 2 months, significant improvement, she feels much better, and no side effects or issues  Currently on Saxenda 3.79m daily injection Goal to continue weight down toward her previous wt goal < 130 lbs She will adjust dose if needed Now improving her diet and exercising more. She feels her appetite is curbed on medication and it has helped her.     Admits in life she has never been more than 10 lbs overweight ever. She has always done low carb / low calorie diet in the past with success   She has been eating well still. She does not take in much sugar or soda, candy, sweets.   Weight trend since 2019, with weight initially 130 lbs up to approx 171 lbs now 4 years later.   Admits if she adheres to low carb diet, she will strongly crave sweets and that helps.   She has tried low calorie diet now, and eats fruits. She lowered to 800 calorie per day and felt starving.   Exercise is limited, she has been jogging and walking. But difficult with attempted jogging again feels like carrying too much weight.   She has a reasonable accommodation for work with her migraines. She doesn't go anywhere, she feels body image issues.       11/28/2021    3:17 PM 05/22/2021    3:31 PM 03/17/2020   11:46 AM  Depression screen PHQ 2/9  Decreased Interest 2 0 1  Down, Depressed, Hopeless 1 0 0  PHQ - 2 Score 3 0 1  Altered sleeping 0 2 1  Tired, decreased energy 1 1 1   Change in appetite 0 1 0  Feeling bad or failure about yourself  1 0 0  Trouble concentrating 2 1 1   Moving slowly or fidgety/restless 1 0 0  Suicidal thoughts 0 0 0  PHQ-9 Score 8 5 4   Difficult doing work/chores Somewhat difficult Not difficult at all Somewhat difficult     Social History   Tobacco Use   Smoking status: Some Days    Packs/day: 0.25    Years: 20.00    Total pack years: 5.00    Types: Cigarettes   Smokeless tobacco: Never   Tobacco comments:    some days  Vaping Use   Vaping Use: Never used  Substance Use Topics   Alcohol use: Never   Drug use: Never    Review of Systems Per HPI unless specifically indicated above     Objective:    BP 104/62   Pulse 79   Ht 5' 3.5" (1.613 m)   Wt 163 lb 9.6 oz (74.2 kg)   SpO2 96%   BMI 28.53 kg/m   Wt Readings from Last 3 Encounters:  02/06/22 163 lb 9.6 oz (74.2 kg)  11/28/21 173 lb (78.5 kg)  10/09/21 172 lb (78 kg)    Physical Exam   Results for orders placed or performed in visit on 11/14/20  CBC with Differential/Platelets  Result Value Ref Range   WBC 9.6 3.4 - 10.8 x10E3/uL   RBC 4.38 3.77 - 5.28 x10E6/uL   Hemoglobin 13.9 11.1 - 15.9 g/dL   Hematocrit 41.4 34.0 - 46.6 %   MCV  95 79 - 97 fL   MCH 31.7 26.6 - 33.0 pg   MCHC 33.6 31.5 - 35.7 g/dL   RDW 12.4 11.7 - 15.4 %   Platelets 291 150 - 450 x10E3/uL   Neutrophils 56 Not Estab. %   Lymphs 33 Not Estab. %   Monocytes 6 Not Estab. %   Eos 4 Not Estab. %   Basos 1 Not Estab. %   Neutrophils Absolute 5.3 1.4 - 7.0 x10E3/uL   Lymphocytes Absolute 3.2 (H) 0.7 - 3.1 x10E3/uL   Monocytes Absolute 0.6 0.1 - 0.9 x10E3/uL   EOS (ABSOLUTE) 0.4 0.0 - 0.4 x10E3/uL   Basophils Absolute 0.1 0.0 - 0.2 x10E3/uL   Immature Granulocytes 0 Not Estab. %   Immature Grans (Abs) 0.0 0.0 - 0.1 x10E3/uL  Comprehensive metabolic panel  Result Value Ref Range   Glucose 89 65 - 99 mg/dL   BUN 16 6 - 24 mg/dL   Creatinine, Ser 0.71 0.57 - 1.00 mg/dL   eGFR 102 >59 mL/min/1.73   BUN/Creatinine Ratio 23 9 - 23   Sodium 135 134 - 144 mmol/L   Potassium 4.3 3.5 - 5.2 mmol/L   Chloride 99 96 - 106 mmol/L   CO2 22 20 - 29 mmol/L   Calcium 9.5 8.7 - 10.2 mg/dL   Total Protein 6.4 6.0 - 8.5 g/dL   Albumin 4.3 3.8 - 4.9 g/dL    Globulin, Total 2.1 1.5 - 4.5 g/dL   Albumin/Globulin Ratio 2.0 1.2 - 2.2   Bilirubin Total <0.2 0.0 - 1.2 mg/dL   Alkaline Phosphatase 64 44 - 121 IU/L   AST 19 0 - 40 IU/L   ALT 10 0 - 32 IU/L  TSH  Result Value Ref Range   TSH 2.380 0.450 - 4.500 uIU/mL      Assessment & Plan:   Problem List Items Addressed This Visit   None Visit Diagnoses     Obesity (BMI 30.0-34.9)    -  Primary   Relevant Medications   SAXENDA 18 MG/3ML SOPN       Meds ordered this encounter  Medications   SAXENDA 18 MG/3ML SOPN    Sig: Inject 3 mg into the skin daily.    Dispense:  15 mL    Refill:  2      Follow up plan: Return in about 3 months (around 05/08/2022) for 3 months Annual Physical AM apt fasting lab after.   Nobie Putnam, [facility] 02/06/2022, 11:02 AM

## 2022-02-06 NOTE — Patient Instructions (Addendum)
Thank you for coming to the office today.  Re ordered Saxenda 3.0mg  daily injection, to [facility], goal is to scale back on the dose in future and try to adjust this to a maintenance dose.  DUE for FASTING BLOOD WORK (no food or drink after midnight before the lab appointment, only water or coffee without cream/sugar on the morning of) Next visit after we discuss - lab draw in 3 MONTHS    Please schedule a Follow-up Appointment to: Return in about 3 months (around 05/08/2022) for 3 months Annual Physical AM apt fasting lab after.  If you have any other questions or concerns, please feel free to call the office or send a message through [place]. You may also schedule an earlier appointment if necessary.  Additionally, you may be receiving a survey about your experience at our office within a few days to 1 week by e-mail or mail. We value your feedback.  Nobie Putnam, DO [place]

## 2022-02-25 ENCOUNTER — Other Ambulatory Visit: Payer: Self-pay | Admitting: Family Medicine

## 2022-02-25 ENCOUNTER — Encounter: Payer: Self-pay | Admitting: Family Medicine

## 2022-02-25 DIAGNOSIS — E669 Obesity, unspecified: Secondary | ICD-10-CM

## 2022-02-25 DIAGNOSIS — E66811 Obesity, class 1: Secondary | ICD-10-CM

## 2022-02-26 NOTE — Telephone Encounter (Signed)
Requested medication (s) are due for refill today: no  Requested medication (s) are on the active medication list: yes  Last refill:  02/06/22 #11mL/2  Future visit scheduled: yes  Notes to clinic:  medication has been on backorder, pt sent mychart message for different alternatives. Please advise.      Requested Prescriptions  Pending Prescriptions Disp Refills   SAXENDA 18 MG/3ML SOPN [Pharmacy Med Name: Saxenda 18 MG/3ML Subcutaneous Solution Pen-injector] 15 mL 0    Sig: INJECT 1.8MG  INTO THE SKIN DAILY     Endocrinology:  Diabetes - GLP-1 Receptor Agonists Failed - 02/25/2022 12:52 PM      Failed - HBA1C is between 0 and 7.9 and within 180 days    No results found for: "HGBA1C", "LABA1C"       Passed - Valid encounter within last 6 months    Recent Outpatient Visits           2 weeks ago Obesity (BMI 30.0-34.9)   [place], DO   3 months ago Obesity (BMI 30.0-34.9)   [place], DO   9 months ago Primary insomnia   [place], DO   1 year ago Acute viral syndrome   [place], DO   1 year ago Abnormal uterine bleeding (AUB)   [facility] [place] Devonne Doughty, DO       Future Appointments             In 2 months [place], Devonne Doughty, DO [facility], [facility]

## 2022-03-01 ENCOUNTER — Ambulatory Visit: Payer: Federal, State, Local not specified - PPO | Admitting: Family Medicine

## 2022-03-14 IMAGING — US US PELVIS COMPLETE WITH TRANSVAGINAL
1 series · 13 of 25 positions shown · non-contrast
Comparison: No prior.

CLINICAL DATA: Menorrhagia.  Irregular cycle.  History of biopsy.

EXAM:
TRANSABDOMINAL ULTRASOUND OF PELVIS
TECHNIQUE: Transabdominal ultrasound examination of the pelvis was performed
including evaluation of the uterus, ovaries, adnexal regions, and
pelvic cul-de-sac.

[Series 1: us pelvic complete with transvaginal · 13 of 57 slices shown]
[im 1/57]
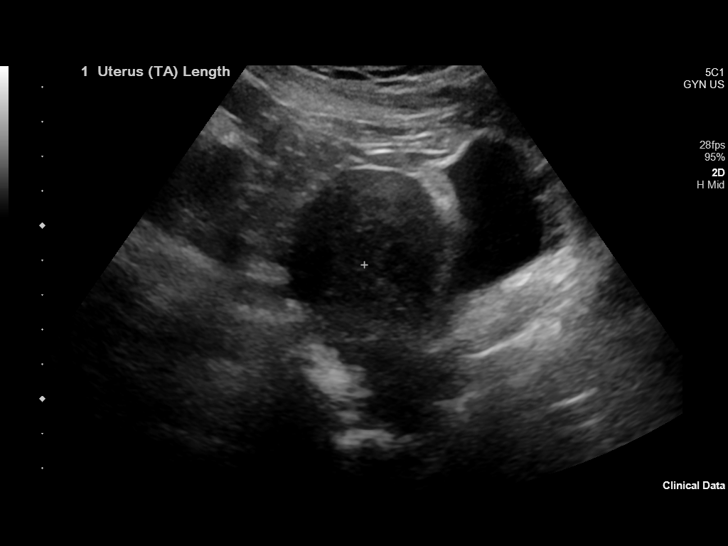
[im 5/57]
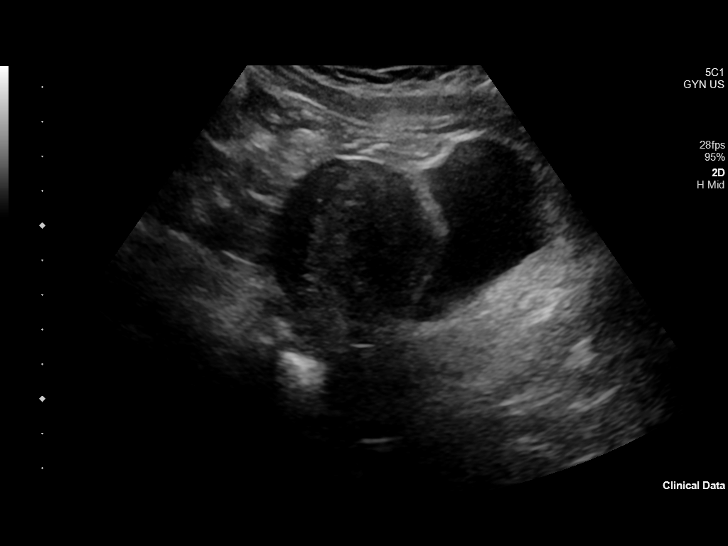
[im 10/57]
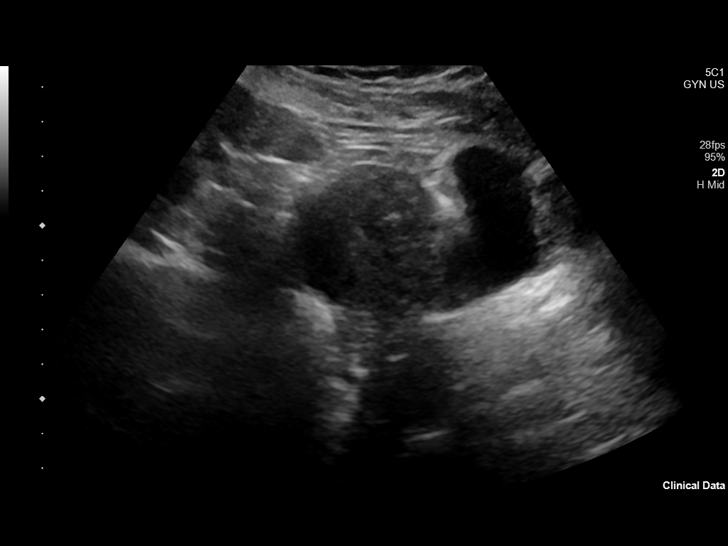
[im 15/57]
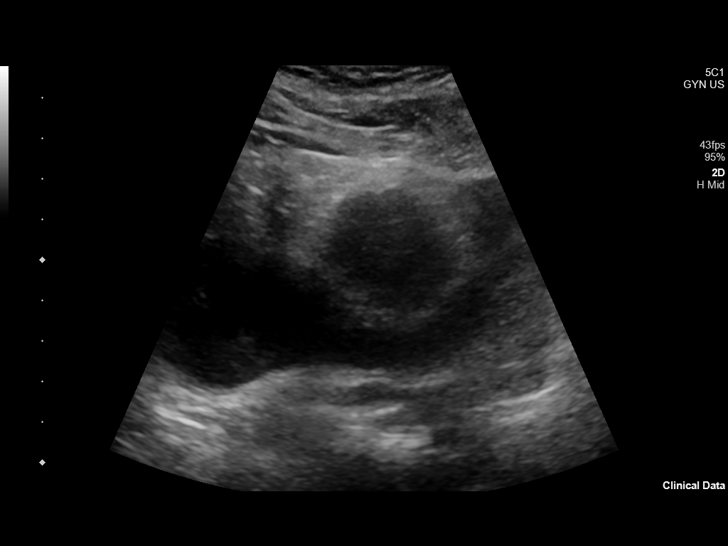
[im 19/57]
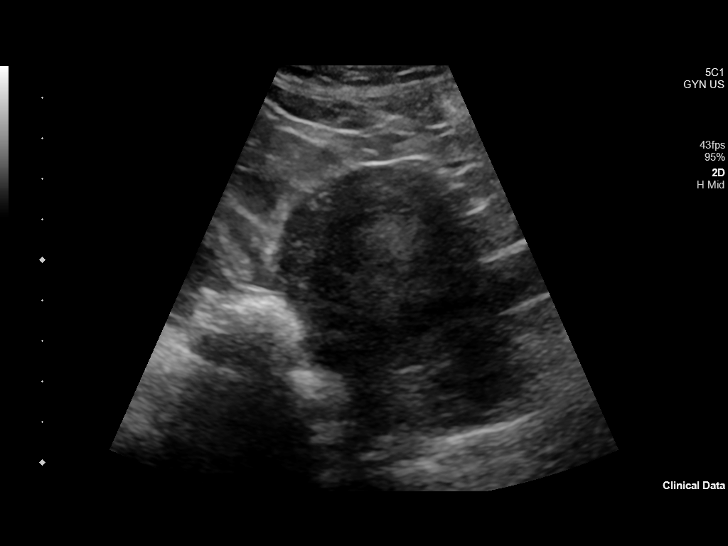
[im 24/57]
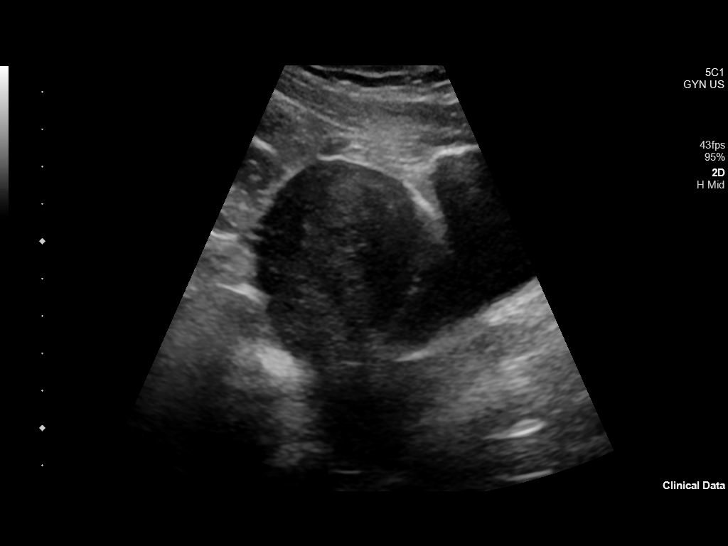
[im 29/57]
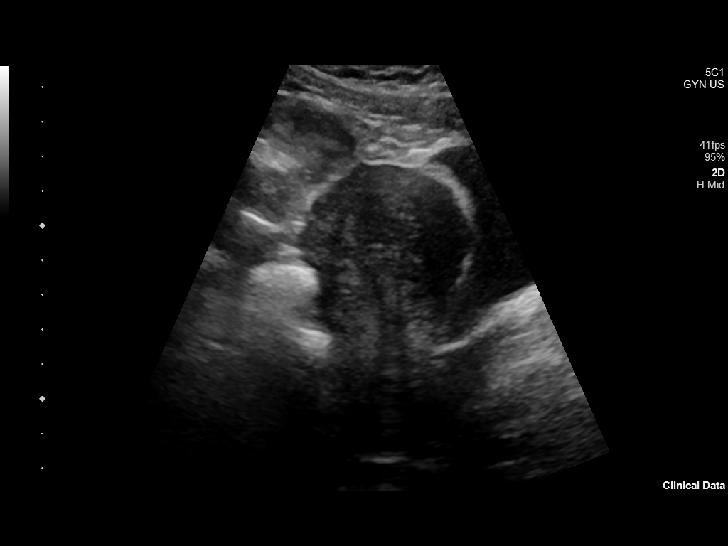
[im 33/57]
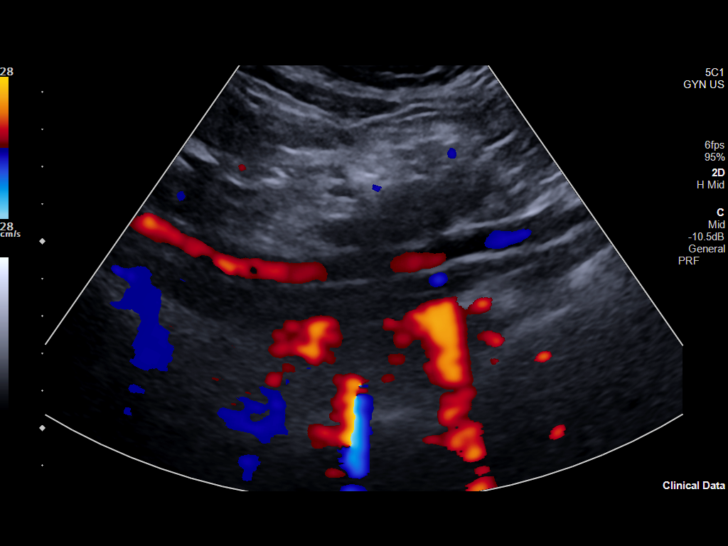
[im 38/57]
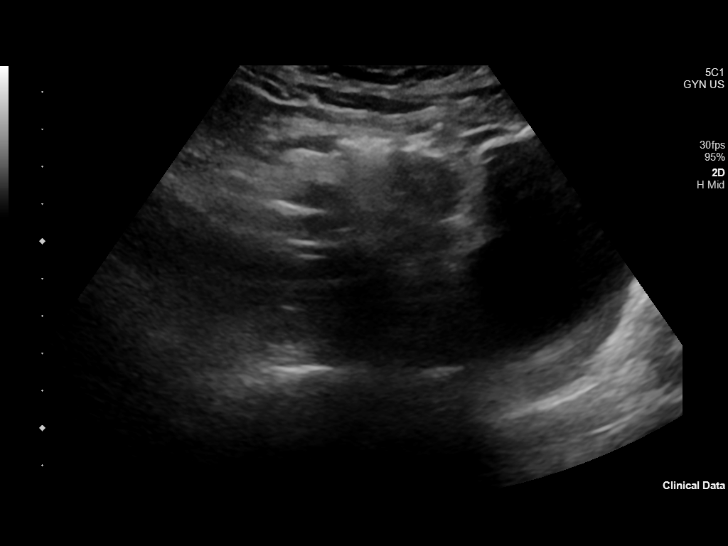
[im 43/57]
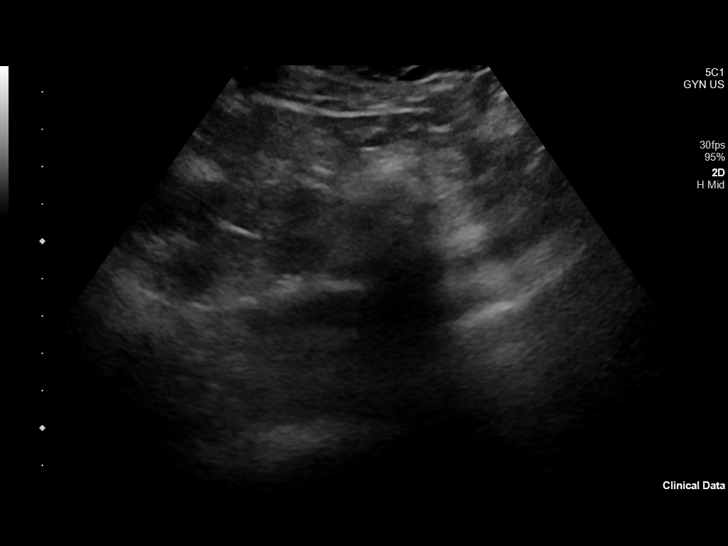
[im 47/57]
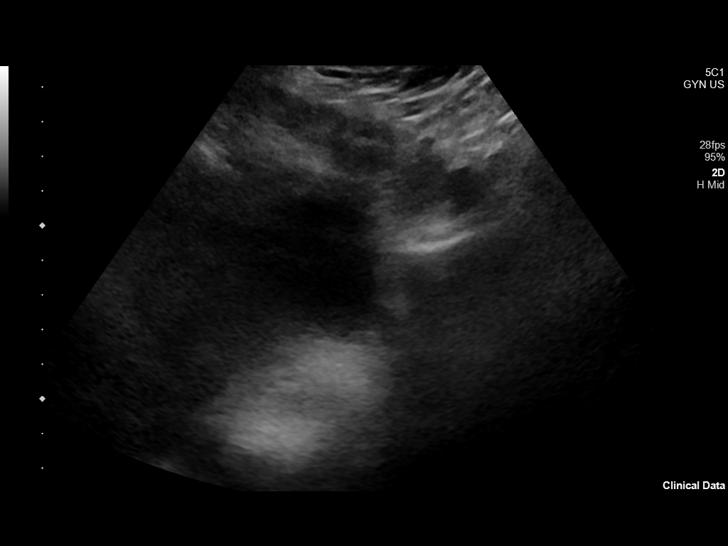
[im 52/57]
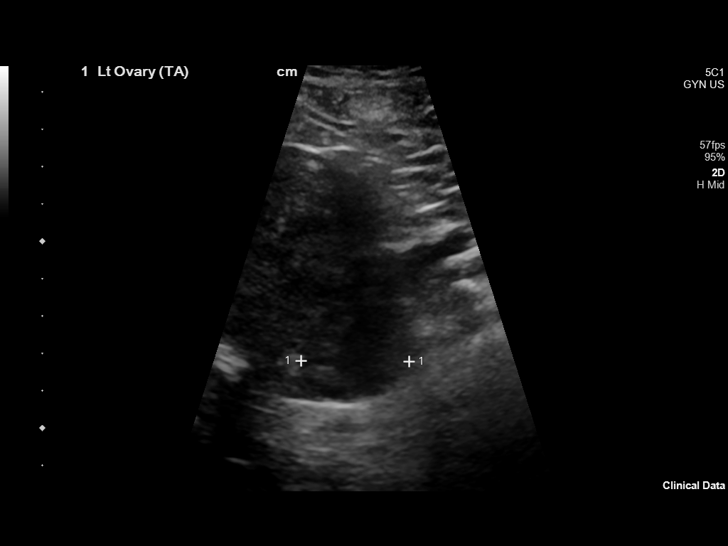
[im 57/57]
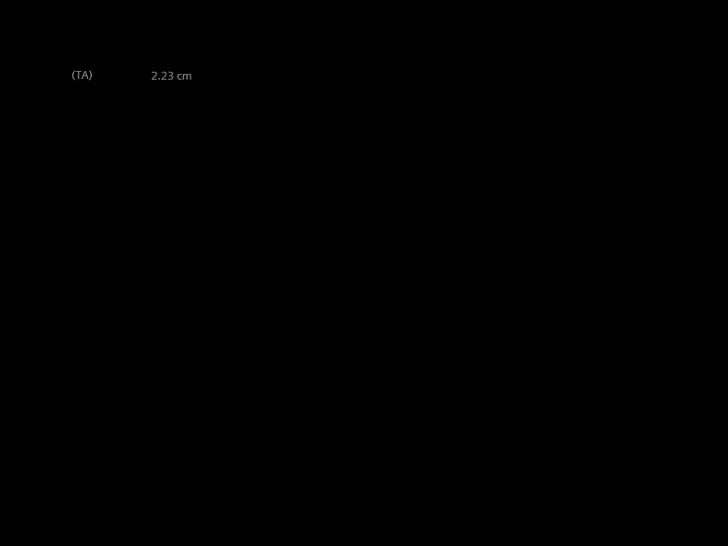

[13 of 25 positions shown; findings below may reference images not displayed]

FINDINGS: Uterus

Measurements: 7.9 x 4.7 x 5.1 cm = volume: 98.7 mL. No fibroids or
other mass visualized.

Endometrium

Thickness: 22.3.  No focal abnormality visualized.

Right ovary

Measurements: Not visualized

Left ovary

Measurements: 3.8 x 2.5 x 2.9 cm = volume: 14.5 mL. Normal
appearance/no adnexal mass. Color flow noted.

Other findings:  No abnormal free fluid.
IMPRESSION: 1. Endometrial thickening at 22.3 mm. If bleeding remains
unresponsive to hormonal or medical therapy, focal lesion work-up
with sonohysterogram should be considered. Endometrial biopsy should
also be considered in pre-menopausal patients at high risk for
endometrial carcinoma. (Ref: Radiological Reasoning: Algorithmic
Workup of Abnormal Vaginal Bleeding with Endovaginal Sonography and
Sonohysterography. AJR [id number]; 191:S68-73).

2. Right ovary not visualized. Left ovary appears unremarkable. No
free pelvic fluid.

## 2022-03-20 ENCOUNTER — Other Ambulatory Visit: Payer: Self-pay | Admitting: Family Medicine

## 2022-03-20 DIAGNOSIS — F5101 Primary insomnia: Secondary | ICD-10-CM

## 2022-03-20 NOTE — Telephone Encounter (Signed)
Requested medication (s) are due for refill today: yes  Requested medication (s) are on the active medication list: yes  Last refill:  09/17/21 #60 with 5 RF  Future visit scheduled: 05/10/22, seen 02/06/22  Notes to clinic:  This medication can not be delegated, please assess.        Requested Prescriptions  Pending Prescriptions Disp Refills   temazepam (RESTORIL) 15 MG capsule [Pharmacy Med Name: TEMAZEPAM 15MG  CAPSULES] 60 capsule     Sig: TAKE 1 TO 2 CAPSULES(15 TO 30 MG) BY MOUTH AT BEDTIME AS NEEDED FOR SLEEP     Not Delegated - Psychiatry: Anxiolytics/Hypnotics 2 Failed - 03/20/2022 10:04 AM      Failed - This refill cannot be delegated      Failed - Urine Drug Screen completed in last 360 days      Passed - Patient is not pregnant      Passed - Valid encounter within last 6 months    Recent Outpatient Visits           1 month ago Obesity (BMI 30.0-34.9)   [place], DO   3 months ago Obesity (BMI 30.0-34.9)   [place], DO   10 months ago Primary insomnia   [place], DO   1 year ago Acute viral syndrome   [place], DO   1 year ago Abnormal uterine bleeding (AUB)   [facility] [place] Doughty, DO       Future Appointments             In 1 month [place], Devonne Doughty, DO [facility], [facility]

## 2022-03-27 ENCOUNTER — Telehealth: Payer: Self-pay | Admitting: *Deleted

## 2022-03-27 NOTE — Progress Notes (Unsigned)
12/31/21: botox continues to work well for her migraines. She was a little late getting botox this time which means she has had more headches.    BOTOX PROCEDURE NOTE FOR MIGRAINE HEADACHE    Contraindications and precautions discussed with patient(above). Aseptic procedure was observed and patient tolerated procedure. Procedure performed by Butch Penny, NP  The condition has existed for more than 6 months, and pt does not have a diagnosis of ALS, Myasthenia Gravis or Lambert-Eaton Syndrome.  Risks and benefits of injections discussed and pt agrees to proceed with the procedure.  Written consent obtained  These injections are medically necessary.  These injections do not cause sedations or hallucinations which the oral therapies may cause.  Indication/Diagnosis: chronic migraine BOTOX(J0585) injection was performed according to protocol by [employer]. 200 units of BOTOX was dissolved into 4 cc NS.   NDC: [address]-85  Type of toxin: Botox  Botox- 200 units x 1 vial Lot: [id number] Expiration: 02/206 NDC: 2637-8588-50   Bacteriostatic 0.9% Sodium Chloride- 68mL total Lot: [id number] Expiration: 12/19/2022 NDC: 2774-1287-86   Dx: V67.209  B/B   Description of procedure:  The patient was placed in a sitting position. The standard protocol was used for Botox as follows, with 5 units of Botox injected at each site:   -Procerus muscle, midline injection  -Corrugator muscle, bilateral injection  -Frontalis muscle, bilateral injection, with 2 sites each side, medial injection was performed in the upper one third of the frontalis muscle, in the region vertical from the medial inferior edge of the superior orbital rim. The lateral injection was again in the upper one third of the forehead vertically above the lateral limbus of the cornea, 1.5 cm lateral to the medial injection site.  -Temporalis muscle injection, 4 sites, bilaterally. The first injection was 3 cm above the tragus of  the ear, second injection site was 1.5 cm to 3 cm up from the first injection site in line with the tragus of the ear. The third injection site was 1.5-3 cm forward between the first 2 injection sites. The fourth injection site was 1.5 cm posterior to the second injection site.  -Occipitalis muscle injection, 3 sites, bilaterally. The first injection was done one half way between the occipital protuberance and the tip of the mastoid process behind the ear. The second injection site was done lateral and superior to the first, 1 fingerbreadth from the first injection. The third injection site was 1 fingerbreadth superiorly and medially from the first injection site.  -Cervical paraspinal muscle injection, 2 sites, bilateral knee first injection site was 1 cm from the midline of the cervical spine, 3 cm inferior to the lower border of the occipital protuberance. The second injection site was 1.5 cm superiorly and laterally to the first injection site.  -Trapezius muscle injection was performed at 3 sites, bilaterally. The first injection site was in the upper trapezius muscle halfway between the inflection point of the neck, and the acromion. The second injection site was one half way between the acromion and the first injection site. The third injection was done between the first injection site and the inflection point of the neck.   Will return for repeat injection in 3 months.   A 200 unit sof Botox was used, 155 units were injected, the rest of the Botox was wasted. The patient tolerated the procedure well, there were no complications of the above procedure.  Butch Penny, MSN, NP-C 03/27/2022, 4:57 PM [facility] [address]  [place], [place] [address] [phone]

## 2022-03-27 NOTE — Telephone Encounter (Signed)
Patient has a Botox appointment tomorrow 03/28/22. I called the [employer] FEP and spoke with Lilla Shook. I was told [phone] and [phone] do not require PA as long as it is billed under the medical benefit. If specialty pharmacy is used, will need PA under pharmacy benefit. Reference # for call is [id number].

## 2022-03-28 ENCOUNTER — Ambulatory Visit: Payer: Federal, State, Local not specified - PPO | Admitting: Adult Health

## 2022-03-28 DIAGNOSIS — G43709 Chronic migraine without aura, not intractable, without status migrainosus: Secondary | ICD-10-CM | POA: Diagnosis not present

## 2022-03-28 MED ORDER — ONABOTULINUMTOXINA 200 UNITS IJ SOLR
155.0000 [IU] | Freq: Once | INTRAMUSCULAR | Status: AC
  Administered 2022-03-28: 155 [IU] via INTRAMUSCULAR

## 2022-03-28 NOTE — Progress Notes (Signed)
Botox- 200 units x 1 vial Lot: [id number] Expiration: 08/2024 NDC: 4917-9150-56  Bacteriostatic 0.9% Sodium Chloride- 57mL total Lot:  Expiration: 01/19/2023 NDC: 9794-8016-55  Dx: V74.827  B/B

## 2022-05-10 ENCOUNTER — Encounter: Payer: Federal, State, Local not specified - PPO | Admitting: Family Medicine

## 2022-06-14 ENCOUNTER — Encounter: Payer: Federal, State, Local not specified - PPO | Admitting: Family Medicine

## 2022-06-19 ENCOUNTER — Encounter: Payer: Self-pay | Admitting: *Deleted

## 2022-06-19 NOTE — Telephone Encounter (Signed)
Pt called back with [employer] ins information. The member ID number had not changed. E-verification successful and is updated in her chart. Pt will bring card to can tomorrow

## 2022-06-20 ENCOUNTER — Ambulatory Visit (INDEPENDENT_AMBULATORY_CARE_PROVIDER_SITE_OTHER): Payer: Federal, State, Local not specified - PPO | Admitting: Adult Health

## 2022-06-20 DIAGNOSIS — G43709 Chronic migraine without aura, not intractable, without status migrainosus: Secondary | ICD-10-CM | POA: Diagnosis not present

## 2022-06-20 MED ORDER — ONABOTULINUMTOXINA 200 UNITS IJ SOLR
155.0000 [IU] | Freq: Once | INTRAMUSCULAR | Status: AC
  Administered 2022-06-20: 155 [IU] via INTRAMUSCULAR

## 2022-06-20 NOTE — Progress Notes (Signed)
Botox- 200 units x 1 vial Lot: [id number]  Expiration: 10/2024 NDC: 7897-8478-41  Bacteriostatic 0.9% Sodium Chloride- 76mL total Lot: [id number] Expiration: 11/25 NDC: 88719-597-47  Dx: V85.501  B/B

## 2022-06-20 NOTE — Progress Notes (Signed)
/  06/20/22: Migraines are well-controlled.  She does get breakthrough headaches 2 weeks before her Botox is due.  Uses rizatriptan for abortive therapy  03/28/2022: Migraines well controlled  12/31/21: botox continues to work well for her migraines. She was a little late getting botox this time which means she has had more headches.    BOTOX PROCEDURE NOTE FOR MIGRAINE HEADACHE    Contraindications and precautions discussed with patient(above). Aseptic procedure was observed and patient tolerated procedure. Procedure performed by Ward Givens, NP  The condition has existed for more than 6 months, and pt does not have a diagnosis of ALS, Myasthenia Gravis or Lambert-Eaton Syndrome.  Risks and benefits of injections discussed and pt agrees to proceed with the procedure.  Written consent obtained  These injections are medically necessary.  These injections do not cause sedations or hallucinations which the oral therapies may cause.  Indication/Diagnosis: chronic migraine BOTOX(J0585) injection was performed according to protocol by [employer]. 200 units of BOTOX was dissolved into 4 cc NS.   NDC: [address]-53  Type of toxin: Botox  Botox- 200 units x 1 vial Lot: [id number]  Expiration: 10/2024 NDC: 4008-6761-95   Bacteriostatic 0.9% Sodium Chloride- 29mL total Lot: [id number] Expiration: 11/25 NDC: 24580-998-33   Dx: A25.053        Description of procedure:  The patient was placed in a sitting position. The standard protocol was used for Botox as follows, with 5 units of Botox injected at each site:   -Procerus muscle, midline injection  -Corrugator muscle, bilateral injection  -Frontalis muscle, bilateral injection, with 2 sites each side, medial injection was performed in the upper one third of the frontalis muscle, in the region vertical from the medial inferior edge of the superior orbital rim. The lateral injection was again in the upper one third of the forehead vertically  above the lateral limbus of the cornea, 1.5 cm lateral to the medial injection site.  -Temporalis muscle injection, 4 sites, bilaterally. The first injection was 3 cm above the tragus of the ear, second injection site was 1.5 cm to 3 cm up from the first injection site in line with the tragus of the ear. The third injection site was 1.5-3 cm forward between the first 2 injection sites. The fourth injection site was 1.5 cm posterior to the second injection site.  -Occipitalis muscle injection, 3 sites, bilaterally. The first injection was done one half way between the occipital protuberance and the tip of the mastoid process behind the ear. The second injection site was done lateral and superior to the first, 1 fingerbreadth from the first injection. The third injection site was 1 fingerbreadth superiorly and medially from the first injection site.  -Cervical paraspinal muscle injection, 2 sites, bilateral knee first injection site was 1 cm from the midline of the cervical spine, 3 cm inferior to the lower border of the occipital protuberance. The second injection site was 1.5 cm superiorly and laterally to the first injection site.  -Trapezius muscle injection was performed at 3 sites, bilaterally. The first injection site was in the upper trapezius muscle halfway between the inflection point of the neck, and the acromion. The second injection site was one half way between the acromion and the first injection site. The third injection was done between the first injection site and the inflection point of the neck.   Will return for repeat injection in 3 months.   A 200 unit sof Botox was used, 155 units were injected, the rest of  the Botox was wasted. The patient tolerated the procedure well, there were no complications of the above procedure.  Ward Givens, MSN, NP-C 06/20/2022, 3:07 PM [facility] [address], [place], [place] [address] [phone]

## 2022-06-21 ENCOUNTER — Encounter: Payer: Self-pay | Admitting: Family Medicine

## 2022-06-21 ENCOUNTER — Ambulatory Visit (INDEPENDENT_AMBULATORY_CARE_PROVIDER_SITE_OTHER): Payer: Federal, State, Local not specified - PPO | Admitting: Family Medicine

## 2022-06-21 VITALS — BP 118/74 | HR 56 | Ht 63.5 in | Wt 152.6 lb

## 2022-06-21 DIAGNOSIS — F419 Anxiety disorder, unspecified: Secondary | ICD-10-CM

## 2022-06-21 DIAGNOSIS — F5101 Primary insomnia: Secondary | ICD-10-CM

## 2022-06-21 DIAGNOSIS — E538 Deficiency of other specified B group vitamins: Secondary | ICD-10-CM

## 2022-06-21 DIAGNOSIS — Z0000 Encounter for general adult medical examination without abnormal findings: Secondary | ICD-10-CM

## 2022-06-21 DIAGNOSIS — Z1211 Encounter for screening for malignant neoplasm of colon: Secondary | ICD-10-CM

## 2022-06-21 DIAGNOSIS — N921 Excessive and frequent menstruation with irregular cycle: Secondary | ICD-10-CM

## 2022-06-21 DIAGNOSIS — E559 Vitamin D deficiency, unspecified: Secondary | ICD-10-CM | POA: Diagnosis not present

## 2022-06-21 DIAGNOSIS — R9389 Abnormal findings on diagnostic imaging of other specified body structures: Secondary | ICD-10-CM

## 2022-06-21 DIAGNOSIS — G43719 Chronic migraine without aura, intractable, without status migrainosus: Secondary | ICD-10-CM

## 2022-06-21 DIAGNOSIS — Z1231 Encounter for screening mammogram for malignant neoplasm of breast: Secondary | ICD-10-CM

## 2022-06-21 MED ORDER — VENLAFAXINE HCL ER 150 MG PO CP24: 150.0000 mg | ORAL_CAPSULE | Freq: Every day | ORAL | 3 refills | Status: DC

## 2022-06-21 NOTE — Progress Notes (Signed)
Subjective:    Patient ID: Sharon Ponce, female    DOB: May 27, 1970, 52 y.o.   MRN: [id number]  Sharon Ponce is a 52 y.o. female presenting on 06/21/2022 for Annual Exam   HPI  Here for Annual Physical and due for fasting lab orders today  [facility] On We[facility] completed course, now will receive B12 injections Wt loss 20 lbs in 6 months since 11/2021  Additional update R Abdominal Pain - now resolved 2 nights ago she had R abdominal pain severe x 2 hours, then resolved Not related to eating or other symptoms No bowel dysfunction Eventually resolved She has gallbladder / appendix still present.  Health Maintenance:  Plan on 2025 - female provider / Nicki Reaper, FNP - pap smear  Due for Mammogram update  Due for Colon CA Screen, willing to pursue Cologuard   Past Surgical History:  Procedure Laterality Date   BUNIONECTOMY     DILITATION & CURRETTAGE/HYSTROSCOPY WITH NOVASURE ABLATION N/A 10/31/2020   Procedure: DILATATION & CURETTAGE/HYSTEROSCOPY WITH NOVASURE ABLATION;  Surgeon: Conard Novak, MD;  Location: [facility] OR[facility];  Service: Gynecology;  Laterality: N/A;   PLACEMENT OF BREAST IMPLANTS  2005   TOOTH EXTRACTION  2012       11/28/2021    3:17 PM 05/22/2021    3:31 PM 03/17/2020   11:46 AM  Depression screen PHQ 2/9  Decreased Interest 2 0 1  Down, Depressed, Hopeless 1 0 0  PHQ - 2 Score 3 0 1  Altered sleeping 0 2 1  Tired, decreased energy 1 1 1   Change in appetite 0 1 0  Feeling bad or failure about yourself  1 0 0  Trouble concentrating 2 1 1   Moving slowly or fidgety/restless 1 0 0  Suicidal thoughts 0 0 0  PHQ-9 Score 8 5 4   Difficult doing work/chores Somewhat difficult Not difficult at all Somewhat difficult    Past Medical History:  Diagnosis Date   Allergy    Anxiety    Migraine    Past Surgical History:  Procedure Laterality Date   BUNIONECTOMY     DILITATION & CURRETTAGE/HYSTROSCOPY WITH NOVASURE ABLATION  N/A 10/31/2020   Procedure: DILATATION & CURETTAGE/HYSTEROSCOPY WITH NOVASURE ABLATION;  Surgeon: , MD;  Location: [facility];  Service: Gynecology;  Laterality: N/A;   PLACEMENT OF BREAST IMPLANTS  2005   TOOTH EXTRACTION  2012   Social History   Socioeconomic History   Marital status: Single    Spouse name: Not on file   Number of children: Not on file   Years of education: Not on file   Highest education level: Master's degree (e.g., MA, MS, MEng, MEd, MSW, MBA)  Occupational History   Occupation: Conard Novak: [employer]  Tobacco Use   Smoking status: Some Days    Packs/day: 0.25    Years: 20.00    Total pack years: 5.00    Types: Cigarettes   Smokeless tobacco: Never   Tobacco comments:    some days  Vaping Use   Vaping Use: Never used  Substance and Sexual Activity   Alcohol use: Never   Drug use: Never   Sexual activity: Not Currently    Birth control/protection: None  Other Topics Concern   Not on file  Social History Narrative   Lives alone    Right handed   Caffeine: approx 6 cups/day   Social Determinants of Health   Financial Resource Strain: Low Risk  (  01/23/2018)   Overall Financial Resource Strain (CARDIA)    Difficulty of Paying Living Expenses: Not hard at all  Food Insecurity: No Food Insecurity (01/23/2018)   Hunger Vital Sign    Worried About Running Out of Food in the Last Year: Never true    Ran Out of Food in the Last Year: Never true  Transportation Needs: No Transportation Needs (01/23/2018)   PRAPARE - Hydrologist (Medical): No    Lack of Transportation (Non-Medical): No  Physical Activity: Sufficiently Active (01/23/2018)   Exercise Vital Sign    Days of Exercise per Week: 4 days    Minutes of Exercise per Session: 50 min  Stress: No Stress Concern Present (01/23/2018)   [place]    Feeling of Stress : Only a little  Social  Connections: Moderately Isolated (05/04/2018)   Social Connection and Isolation Panel [NHANES]    Frequency of Communication with Friends and Family: More than three times a week    Frequency of Social Gatherings with Friends and Family: Once a week    Attends Religious Services: Never    Marine scientist or Organizations: No    Attends Archivist Meetings: Never    Marital Status: Never married  Intimate Partner Violence: Not At Risk (05/04/2018)   Humiliation, Afraid, Rape, and Kick questionnaire    Fear of Current or Ex-Partner: No    Emotionally Abused: No    Physically Abused: No    Sexually Abused: No   Family History  Problem Relation Age of Onset   Healthy Mother    Healthy Father    Migraines Father    Leukemia Maternal Grandmother    Heart disease Maternal Grandfather    Vision loss Paternal Aunt    Migraines Maternal Aunt    Breast cancer Neg Hx    Colon cancer Neg Hx    Stroke Neg Hx    Ovarian cancer Neg Hx    Current Outpatient Medications on File Prior to Visit  Medication Sig   acidophilus (RISAQUAD) CAPS capsule Take 1 capsule by mouth daily.   B Complex-C (SUPER B COMPLEX PO) Take 1 tablet by mouth daily.   CRANBERRY PO Take 1 tablet by mouth daily.   ELDERBERRY PO Take 1 capsule by mouth daily.   Insulin Pen Needle 32G X 6 MM MISC Use to inject Saxenda into skin daily.   melatonin 5 MG TABS Take 5 mg by mouth at bedtime.   Multiple Vitamin (MULTIVITAMIN) tablet Take 1 tablet by mouth daily.   Nutritional Supplements (DHEA PO) Take 1 capsule by mouth daily.   Omega-3 Fatty Acids (OMEGA-3 FISH OIL PO) Take 1 capsule by mouth daily.   rizatriptan (MAXALT-MLT) 10 MG disintegrating tablet Take 1 tablet (10 mg total) by mouth as needed for migraine. May repeat in 2 hours if needed   temazepam (RESTORIL) 15 MG capsule TAKE 1 TO 2 CAPSULES(15 TO 30 MG) BY MOUTH AT BEDTIME AS NEEDED FOR SLEEP   No current facility-administered medications on file  prior to visit.    Review of Systems  Constitutional:  Negative for activity change, appetite change, chills, diaphoresis, fatigue and fever.  HENT:  Negative for congestion and hearing loss.   Eyes:  Negative for visual disturbance.  Respiratory:  Negative for cough, chest tightness, shortness of breath and wheezing.   Cardiovascular:  Negative for chest pain, palpitations and leg swelling.  Gastrointestinal:  Negative for abdominal pain, constipation, diarrhea, nausea and vomiting.  Genitourinary:  Negative for dysuria, frequency and hematuria.  Musculoskeletal:  Negative for arthralgias and neck pain.  Skin:  Negative for rash.  Neurological:  Negative for dizziness, weakness, light-headedness, numbness and headaches.  Hematological:  Negative for adenopathy.  Psychiatric/Behavioral:  Negative for behavioral problems, dysphoric mood and sleep disturbance.    Per HPI unless specifically indicated above      Objective:    BP 118/74   Pulse (!) 56   Ht 5' 3.5" (1.613 m)   Wt 152 lb 9.6 oz (69.2 kg)   SpO2 96%   BMI 26.61 kg/m   Wt Readings from Last 3 Encounters:  06/21/22 152 lb 9.6 oz (69.2 kg)  02/06/22 163 lb 9.6 oz (74.2 kg)  11/28/21 173 lb (78.5 kg)    Physical Exam Vitals and nursing note reviewed.  Constitutional:      General: She is not in acute distress.    Appearance: She is well-developed. She is not diaphoretic.     Comments: Well-appearing, comfortable, cooperative  HENT:     Head: Normocephalic and atraumatic.  Eyes:     General:        Right eye: No discharge.        Left eye: No discharge.     Conjunctiva/sclera: Conjunctivae normal.     Pupils: Pupils are equal, round, and reactive to light.  Neck:     Thyroid: No thyromegaly.  Cardiovascular:     Rate and Rhythm: Normal rate and regular rhythm.     Pulses: Normal pulses.     Heart sounds: Normal heart sounds. No murmur heard. Pulmonary:     Effort: Pulmonary effort is normal. No respiratory  distress.     Breath sounds: Normal breath sounds. No wheezing or rales.  Abdominal:     General: Bowel sounds are normal. There is no distension.     Palpations: Abdomen is soft. There is no mass.     Tenderness: There is no abdominal tenderness.  Musculoskeletal:        General: No tenderness. Normal range of motion.     Cervical back: Normal range of motion and neck supple.     Comments: Upper / Lower Extremities: - Normal muscle tone, strength bilateral upper extremities 5/5, lower extremities 5/5  Lymphadenopathy:     Cervical: No cervical adenopathy.  Skin:    General: Skin is warm and dry.     Findings: No erythema or rash.  Neurological:     Mental Status: She is alert and oriented to person, place, and time.     Comments: Distal sensation intact to light touch all extremities  Psychiatric:        Mood and Affect: Mood normal.        Behavior: Behavior normal.        Thought Content: Thought content normal.     Comments: Well groomed, good eye contact, normal speech and thoughts       Results for orders placed or performed in visit on 11/14/20  CBC with Differential/Platelets  Result Value Ref Range   WBC 9.6 3.4 - 10.8 x10E3/uL   RBC 4.38 3.77 - 5.28 x10E6/uL   Hemoglobin 13.9 11.1 - 15.9 g/dL   Hematocrit 41.4 34.0 - 46.6 %   MCV 95 79 - 97 fL   MCH 31.7 26.6 - 33.0 pg   MCHC 33.6 31.5 - 35.7 g/dL   RDW 12.4 11.7 - 15.4 %  Platelets 291 150 - 450 x10E3/uL   Neutrophils 56 Not Estab. %   Lymphs 33 Not Estab. %   Monocytes 6 Not Estab. %   Eos 4 Not Estab. %   Basos 1 Not Estab. %   Neutrophils Absolute 5.3 1.4 - 7.0 x10E3/uL   Lymphocytes Absolute 3.2 (H) 0.7 - 3.1 x10E3/uL   Monocytes Absolute 0.6 0.1 - 0.9 x10E3/uL   EOS (ABSOLUTE) 0.4 0.0 - 0.4 x10E3/uL   Basophils Absolute 0.1 0.0 - 0.2 x10E3/uL   Immature Granulocytes 0 Not Estab. %   Immature Grans (Abs) 0.0 0.0 - 0.1 x10E3/uL  Comprehensive metabolic panel  Result Value Ref Range   Glucose 89 65  - 99 mg/dL   BUN 16 6 - 24 mg/dL   Creatinine, Ser 7.09 0.57 - 1.00 mg/dL   eGFR 628 >36 OQ/HUT/6.54   BUN/Creatinine Ratio 23 9 - 23   Sodium 135 134 - 144 mmol/L   Potassium 4.3 3.5 - 5.2 mmol/L   Chloride 99 96 - 106 mmol/L   CO2 22 20 - 29 mmol/L   Calcium 9.5 8.7 - 10.2 mg/dL   Total Protein 6.4 6.0 - 8.5 g/dL   Albumin 4.3 3.8 - 4.9 g/dL   Globulin, Total 2.1 1.5 - 4.5 g/dL   Albumin/Globulin Ratio 2.0 1.2 - 2.2   Bilirubin Total <0.2 0.0 - 1.2 mg/dL   Alkaline Phosphatase 64 44 - 121 IU/L   AST 19 0 - 40 IU/L   ALT 10 0 - 32 IU/L  TSH  Result Value Ref Range   TSH 2.380 0.450 - 4.500 uIU/mL      Assessment & Plan:   Problem List Items Addressed This Visit     Anxiety   Relevant Medications   venlafaxine XR (EFFEXOR-XR) 150 MG 24 hr capsule   Colon cancer screening   Relevant Orders   Cologuard   Intractable chronic migraine without aura and without status migrainosus   Relevant Medications   venlafaxine XR (EFFEXOR-XR) 150 MG 24 hr capsule   Menorrhagia with irregular cycle   Relevant Medications   venlafaxine XR (EFFEXOR-XR) 150 MG 24 hr capsule   Primary insomnia   Thickened endometrium   Relevant Medications   venlafaxine XR (EFFEXOR-XR) 150 MG 24 hr capsule   Other Visit Diagnoses     Annual physical exam    -  Primary   Relevant Orders   COMPLETE METABOLIC PANEL WITH GFR   CBC with Differential/Platelet   Lipid panel   Hemoglobin A1c   TSH   Vitamin D deficiency       Relevant Orders   VITAMIN D 25 Hydroxy (Vit-D Deficiency, Fractures)   Vitamin B12 deficiency       Relevant Orders   Vitamin B12   Encounter for screening mammogram for malignant neoplasm of breast       Relevant Orders   MM 3D SCREEN BREAST W/IMPLANT BILATERAL       Updated Health Maintenance information Fasting lab only Encouraged improvement to lifestyle with diet and exercise Goal of weight loss  [facility] Management [facility] Completed Wegovy  course Now upcoming Inj B12  Refilled Venlafaxine.  Checking labs including Vitamin testing today  Ordered Mammogram screening  Ask them about the modified breast implant testing.  ------- Ordered the Cologuard (home kit) test for colon cancer screening. Stay tuned for further updates.  Orders Placed This Encounter  Procedures   MM 3D SCREEN BREAST W/IMPLANT BILATERAL  Standing Status:   Future    Standing Expiration Date:   06/22/2023    Order Specific Question:   Reason for Exam (SYMPTOM  OR DIAGNOSIS REQUIRED)    Answer:   bilateral breast implants, due for mammogram screening    Order Specific Question:   Is the patient pregnant?    Answer:   No    Order Specific Question:   Preferred imaging location?    Answer:   [facility]   COMPLETE METABOLIC PANEL WITH GFR   CBC with Differential/Platelet   Lipid panel    Order Specific Question:   Has the patient fasted?    Answer:   Yes   Hemoglobin A1c   TSH   VITAMIN D 25 Hydroxy (Vit-D Deficiency, Fractures)   Vitamin B12   Cologuard      Meds ordered this encounter  Medications   venlafaxine XR (EFFEXOR-XR) 150 MG 24 hr capsule    Sig: Take 1 capsule (150 mg total) by mouth at bedtime.    Dispense:  90 capsule    Refill:  3      Follow up plan: Return in about 1 year (around 06/22/2023) for 1 year Annual Physical AM apt fasting lab AFTER.  Nobie Putnam, DO [facility] 06/21/2022, 2:03 PM

## 2022-06-21 NOTE — Patient Instructions (Addendum)
Thank you for coming to the office today.  Refilled Venlafaxine.  Checking labs including Vitamin testing, keep in touch w/ B12 inj  For Mammogram screening for breast cancer   Call the [place] below anytime to schedule your own appointment now that order has been placed.  [place] [facility] [place], [place] [place], [place] [address] Phone: [phone]  Ask them about the modified breast implant testing.  ------- Ordered the Cologuard (home kit) test for colon cancer screening. Stay tuned for further updates.  It will be shipped to you directly. If not received in 2-4 weeks, call us or the company.   If you send it back and no results are received in 2-4 weeks, call us or the company as well!   Colon Cancer Screening: - For all adults age 44+ routine colon cancer screening is highly recommended.     - Recent guidelines from [place] recommend starting age of 70 - Early detection of colon cancer is important, because often there are no warning signs or symptoms, also if found early usually it can be cured. Late stage is hard to treat.   - If Cologuard is NEGATIVE, then it is good for 3 years before next due - If Cologuard is POSITIVE, then it is strongly advised to get a Colonoscopy, which allows the GI doctor to locate the source of the cancer or polyp (even very early stage) and treat it by removing it. ------------------------- Follow instructions to collect sample, you may call the company for any help or questions, 24/7 telephone support at [phone].    Please schedule a Follow-up Appointment to: Return in about 1 year (around 06/22/2023) for 1 year Annual Physical AM apt fasting lab AFTER.  If you have any other questions or concerns, please feel free to call the office or send a message through [place]. You may also schedule an earlier appointment if necessary.  Additionally,  you may be receiving a survey about your experience at our office within a few days to 1 week by e-mail or mail. We value your feedback.  Nobie Putnam, DO [facility]

## 2022-06-22 ENCOUNTER — Encounter: Payer: Self-pay | Admitting: Family Medicine

## 2022-06-22 LAB — CBC WITH DIFFERENTIAL/PLATELET
Absolute Monocytes: 464 cells/uL (ref 200–950)
Basophils Absolute: 82 cells/uL (ref 0–200)
Basophils Relative: 0.9 %
Eosinophils Absolute: 482 cells/uL (ref 15–500)
Eosinophils Relative: 5.3 %
HCT: 39.7 % (ref 35.0–45.0)
Hemoglobin: 13.8 g/dL (ref 11.7–15.5)
Lymphs Abs: 3531 cells/uL (ref 850–3900)
MCH: 31.9 pg (ref 27.0–33.0)
MCHC: 34.8 g/dL (ref 32.0–36.0)
MCV: 91.9 fL (ref 80.0–100.0)
MPV: 10.5 fL (ref 7.5–12.5)
Monocytes Relative: 5.1 %
Neutro Abs: 4541 cells/uL (ref 1500–7800)
Neutrophils Relative %: 49.9 %
Platelets: 287 10*3/uL (ref 140–400)
RBC: 4.32 10*6/uL (ref 3.80–5.10)
RDW: 12.1 % (ref 11.0–15.0)
Total Lymphocyte: 38.8 %
WBC: 9.1 10*3/uL (ref 3.8–10.8)

## 2022-06-22 LAB — LIPID PANEL
Cholesterol: 244 mg/dL — ABNORMAL HIGH (ref <200)
HDL: 67 mg/dL (ref >=50)
LDL Cholesterol (Calc): 161 mg/dL (calc) — ABNORMAL HIGH
Non-HDL Cholesterol (Calc): 177 mg/dL (calc) — ABNORMAL HIGH (ref <130)
Total CHOL/HDL Ratio: 3.6 (calc) (ref <5.0)
Triglycerides: 64 mg/dL (ref <150)

## 2022-06-22 LAB — COMPLETE METABOLIC PANEL WITH GFR
AG Ratio: 1.4 (calc) (ref 1.0–2.5)
ALT: 9 U/L (ref 6–29)
AST: 15 U/L (ref 10–35)
Albumin: 4.2 g/dL (ref 3.6–5.1)
Alkaline phosphatase (APISO): 56 U/L (ref 37–153)
BUN: 14 mg/dL (ref 7–25)
CO2: 26 mmol/L (ref 20–32)
Calcium: 9.5 mg/dL (ref 8.6–10.4)
Chloride: 104 mmol/L (ref 98–110)
Creat: 0.77 mg/dL (ref 0.50–1.03)
Globulin: 3 g/dL (calc) (ref 1.9–3.7)
Glucose, Bld: 81 mg/dL (ref 65–99)
Potassium: 4.7 mmol/L (ref 3.5–5.3)
Sodium: 137 mmol/L (ref 135–146)
Total Bilirubin: 0.4 mg/dL (ref 0.2–1.2)
Total Protein: 7.2 g/dL (ref 6.1–8.1)
eGFR: 92 mL/min/{1.73_m2} (ref >=60)

## 2022-06-22 LAB — VITAMIN D 25 HYDROXY (VIT D DEFICIENCY, FRACTURES): Vit D, 25-Hydroxy: 41 ng/mL (ref 30–100)

## 2022-06-22 LAB — HEMOGLOBIN A1C
Hgb A1c MFr Bld: 5 % of total Hgb (ref <5.7)
Mean Plasma Glucose: 97 mg/dL
eAG (mmol/L): 5.4 mmol/L

## 2022-06-22 LAB — TSH: TSH: 1.46 mIU/L

## 2022-06-22 LAB — VITAMIN B12: Vitamin B-12: 1427 pg/mL — ABNORMAL HIGH (ref 200–1100)

## 2022-09-16 ENCOUNTER — Ambulatory Visit: Payer: Federal, State, Local not specified - PPO | Admitting: Adult Health

## 2022-09-16 ENCOUNTER — Encounter: Payer: Self-pay | Admitting: Adult Health

## 2022-09-16 DIAGNOSIS — G43709 Chronic migraine without aura, not intractable, without status migrainosus: Secondary | ICD-10-CM

## 2022-09-16 MED ORDER — ONABOTULINUMTOXINA 200 UNITS IJ SOLR: 155.0000 [IU] | Freq: Once | INTRAMUSCULAR | Status: DC

## 2022-09-16 MED ORDER — ONABOTULINUMTOXINA 200 UNITS IJ SOLR
155.0000 [IU] | Freq: Once | INTRAMUSCULAR | Status: AC
  Administered 2022-09-16: 155 [IU] via INTRAMUSCULAR

## 2022-09-16 NOTE — Telephone Encounter (Signed)
Spoke with patient and offered 5/1 appt. She was going to take it but doesn't want a no-show so she will come today.

## 2022-09-16 NOTE — Progress Notes (Signed)
/   09/16/22: Botox continues to work well.  Continues to get headaches before Botox is due.  Continues with Maxalt as abortive therapy  06/20/22: Migraines are well-controlled.  She does get breakthrough headaches 2 weeks before her Botox is due.  Uses rizatriptan for abortive therapy  03/28/2022: Migraines well controlled  12/31/21: botox continues to work well for her migraines. She was a little late getting botox this time which means she has had more headches.    BOTOX PROCEDURE NOTE FOR MIGRAINE HEADACHE    Contraindications and precautions discussed with patient(above). Aseptic procedure was observed and patient tolerated procedure. Procedure performed by Butch Penny, NP  The condition has existed for more than 6 months, and pt does not have a diagnosis of ALS, Myasthenia Gravis or Lambert-Eaton Syndrome.  Risks and benefits of injections discussed and pt agrees to proceed with the procedure.  Written consent obtained  These injections are medically necessary.  These injections do not cause sedations or hallucinations which the oral therapies may cause.  Indication/Diagnosis: chronic migraine BOTOX(J0585) injection was performed according to protocol by [employer]. 200 units of BOTOX was dissolved into 4 cc NS.   NDC: [address]-46  Type of toxin: Botox  Patient signed consent Botox- 200 units x 1 vial Lot: [id number] Expiration: 10/2024 NDC: 1324-4010-27   Bacteriostatic 0.9% Sodium Chloride- 4 mL  Lot: [id number] Expiration: 11/25 NDC: 03474-259-56   Dx: L87.564     Description of procedure:  The patient was placed in a sitting position. The standard protocol was used for Botox as follows, with 5 units of Botox injected at each site:   -Procerus muscle, midline injection  -Corrugator muscle, bilateral injection  -Frontalis muscle, bilateral injection, with 2 sites each side, medial injection was performed in the upper one third of the frontalis muscle, in the  region vertical from the medial inferior edge of the superior orbital rim. The lateral injection was again in the upper one third of the forehead vertically above the lateral limbus of the cornea, 1.5 cm lateral to the medial injection site.  -Temporalis muscle injection, 4 sites, bilaterally. The first injection was 3 cm above the tragus of the ear, second injection site was 1.5 cm to 3 cm up from the first injection site in line with the tragus of the ear. The third injection site was 1.5-3 cm forward between the first 2 injection sites. The fourth injection site was 1.5 cm posterior to the second injection site.  -Occipitalis muscle injection, 3 sites, bilaterally. The first injection was done one half way between the occipital protuberance and the tip of the mastoid process behind the ear. The second injection site was done lateral and superior to the first, 1 fingerbreadth from the first injection. The third injection site was 1 fingerbreadth superiorly and medially from the first injection site.  -Cervical paraspinal muscle injection, 2 sites, bilateral knee first injection site was 1 cm from the midline of the cervical spine, 3 cm inferior to the lower border of the occipital protuberance. The second injection site was 1.5 cm superiorly and laterally to the first injection site.  -Trapezius muscle injection was performed at 3 sites, bilaterally. The first injection site was in the upper trapezius muscle halfway between the inflection point of the neck, and the acromion. The second injection site was one half way between the acromion and the first injection site. The third injection was done between the first injection site and the inflection point of the neck.  Will return for repeat injection in 3 months.   A 200 unit sof Botox was used, 155 units were injected, the rest of the Botox was wasted. The patient tolerated the procedure well, there were no complications of the above  procedure.  Butch Penny, MSN, NP-C 09/16/2022, 3:16 PM [facility] [address] [place], [place] [address] [phone]

## 2022-09-16 NOTE — Progress Notes (Signed)
Patient signed consent Botox- 200 units x 1 vial Lot: [id number] Expiration: 10/2024 NDC: 5409-8119-14  Bacteriostatic 0.9% Sodium Chloride- 4 mL  Lot: [id number] Expiration: 11/25 NDC: 13086-578-46  Dx: N62.952 B/B Witnessed by S. [facility] CMA

## 2022-09-19 ENCOUNTER — Other Ambulatory Visit: Payer: Self-pay | Admitting: Family Medicine

## 2022-09-19 DIAGNOSIS — F5101 Primary insomnia: Secondary | ICD-10-CM

## 2022-09-19 NOTE — Telephone Encounter (Signed)
Medication Refill - Medication: temazepam (RESTORIL) 15 MG capsule   Has the patient contacted their pharmacy? Yes.    Preferred Pharmacy (with phone number or street name):  [id number] - [place], [place] - [address] AT [facility] OF [address] [facility] Phone: [phone]  Fax: [phone]     Has the patient been seen for an appointment in the last year OR does the patient have an upcoming appointment? No.  Agent: Please be advised that RX refills may take up to 3 business days. We ask that you follow-up with your pharmacy.

## 2022-09-19 NOTE — Telephone Encounter (Signed)
Requested medication (s) are due for refill today -yes  Requested medication (s) are on the active medication list -yes  Future visit scheduled -no  Last refill: 03/21/22 #60 5RF  Notes to clinic: non delegated Rx  Requested Prescriptions  Pending Prescriptions Disp Refills   temazepam (RESTORIL) 15 MG capsule [Pharmacy Med Name: TEMAZEPAM 15MG  CAPSULES] 180 capsule     Sig: TAKE 1 TO 2 CAPSULES(15 TO 30 MG) BY MOUTH AT BEDTIME AS NEEDED FOR SLEEP     Not Delegated - Psychiatry: Anxiolytics/Hypnotics 2 Failed - 09/19/2022  4:46 AM      Failed - This refill cannot be delegated      Failed - Urine Drug Screen completed in last 360 days      Passed - Patient is not pregnant      Passed - Valid encounter within last 6 months    Recent Outpatient Visits           3 months ago Annual physical exam   [place] [facility], [name], DO   7 months ago Obesity (BMI 30.0-34.9)   [place] [facility] [place], [name], DO   9 months ago Obesity (BMI 30.0-34.9)   [place] [facility] Charon, [name], DO   1 year ago Primary insomnia   [facility] Smitty Cords, DO   1 year ago Acute viral syndrome   [place] [facility] Smitty Cords, DO                 Requested Prescriptions  Pending Prescriptions Disp Refills   temazepam (RESTORIL) 15 MG capsule [Pharmacy Med Name: TEMAZEPAM 15MG  CAPSULES] 180 capsule     Sig: TAKE 1 TO 2 CAPSULES(15 TO 30 MG) BY MOUTH AT BEDTIME AS NEEDED FOR SLEEP     Not Delegated - Psychiatry: Anxiolytics/Hypnotics 2 Failed - 09/19/2022  4:46 AM      Failed - This refill cannot be delegated      Failed - Urine Drug Screen completed in last 360 days      Passed - Patient is not pregnant      Passed - Valid encounter within last 6 months    Recent Outpatient Visits           3 months ago Annual  physical exam   [place] [facility] [place], [name], DO   7 months ago Obesity (BMI 30.0-34.9)   [place] [facility] [place], [name], DO   9 months ago Obesity (BMI 30.0-34.9)   [place] [facility] Al[facility] Charon, [name], DO   1 year ago Primary insomnia   [place] [facility] Smitty Cords, DO   1 year ago Acute viral syndrome   [facility] [place], [name], [name]

## 2022-09-20 NOTE — Telephone Encounter (Signed)
Requested medication (s) are due for refill today: no  Requested medication (s) are on the active medication list: yes  Last refill:  09/19/22  Future visit scheduled: no  Notes to clinic:  med not delegated to NT to RF or refuse    Requested Prescriptions  Pending Prescriptions Disp Refills   temazepam (RESTORIL) 15 MG capsule 60 capsule 5     Not Delegated - Psychiatry: Anxiolytics/Hypnotics 2 Failed - 09/19/2022 11:52 AM      Failed - This refill cannot be delegated      Failed - Urine Drug Screen completed in last 360 days      Passed - Patient is not pregnant      Passed - Valid encounter within last 6 months    Recent Outpatient Visits           3 months ago Annual physical exam   [place] [facility] [place], [name], DO   7 months ago Obesity (BMI 30.0-34.9)   [place] [facility] [place], [name], DO   9 months ago Obesity (BMI 30.0-34.9)   [place] [facility] Al[place]a Charon, [name], DO   1 year ago Primary insomnia   [place] [facility] Smitty Cords, DO   1 year ago Acute viral syndrome   [facility] [place], [name], [name]

## 2022-12-18 ENCOUNTER — Ambulatory Visit: Payer: Federal, State, Local not specified - PPO | Admitting: Adult Health

## 2023-01-13 ENCOUNTER — Encounter: Payer: Self-pay | Admitting: Family Medicine

## 2023-01-13 DIAGNOSIS — F5101 Primary insomnia: Secondary | ICD-10-CM | POA: Diagnosis not present

## 2023-01-13 MED ORDER — TEMAZEPAM 15 MG PO CAPS: ORAL_CAPSULE | ORAL | 1 refills | Status: DC

## 2023-01-13 NOTE — Telephone Encounter (Signed)
Please see the MyChart message reply(ies) for my assessment and plan.    This patient gave consent for this Medical Advice Message and is aware that it may result in a bill to their insurance company, as well as the possibility of receiving a bill for a co-payment or deductible. They are an established patient, but are not seeking medical advice exclusively about a problem treated during an in person or video visit in the last seven days. I did not recommend an in person or video visit within seven days of my reply.    I spent a total of 5 minutes cumulative time within 7 days through MyChart messaging.  Alexander Karamalegos, DO   

## 2023-01-27 ENCOUNTER — Encounter: Payer: Self-pay | Admitting: Neurology

## 2023-01-27 ENCOUNTER — Telehealth: Payer: Self-pay | Admitting: Adult Health

## 2023-01-27 ENCOUNTER — Ambulatory Visit: Payer: Federal, State, Local not specified - PPO | Admitting: Neurology

## 2023-01-27 NOTE — Telephone Encounter (Signed)
Pt cancelled appt due to workload at work.

## 2023-03-26 ENCOUNTER — Ambulatory Visit: Payer: Self-pay

## 2023-03-26 NOTE — Telephone Encounter (Signed)
  Chief Complaint: panic attacks Symptoms: anxiety, vomiting, pacing back and forth Frequency: last night 1930 Pertinent Negatives: Patient denies suicidal ideation Disposition: [] ED /[] Urgent Care (no appt availability in office) / [] Appointment(In office/virtual)/ []  [facility]/ [] Home Care/ [] Refused Recommended Disposition /[] [facility]/ []  Follow-up with PCP Additional Notes: pt mentioned due to the election she is going to lose her house and her job and she needs help Reason for Disposition  Patient sounds very upset or troubled to the triager  Answer Assessment - Initial Assessment Questions 1. CONCERN: "Did anything happen that prompted you to call today?"      Panic attacks 2. ANXIETY SYMPTOMS: "Can you describe how you (your loved one; patient) have been feeling?" (e.g., tense, restless, panicky, anxious, keyed up, overwhelmed, sense of impending doom).      crying 3. ONSET: "How long have you been feeling this way?" (e.g., hours, days, weeks)     Since the election 4. SEVERITY: "How would you rate the level of anxiety?" (e.g., 0 - 10; or mild, moderate, severe).     severe 5. FUNCTIONAL IMPAIRMENT: "How have these feelings affected your ability to do daily activities?" "Have you had more difficulty than usual doing your normal daily activities?" (e.g., getting better, same, worse; self-care, school, work, interactions)     No problem  6. HISTORY: "Have you felt this way before?" "Have you ever been diagnosed with an anxiety problem in the past?" (e.g., generalized anxiety disorder, panic attacks, PTSD). If Yes, ask: "How was this problem treated?" (e.g., medicines, counseling, etc.)     Since last  7. RISK OF HARM - SUICIDAL IDEATION: "Do you ever have thoughts of hurting or killing yourself?" If Yes, ask:  "Do you have these feelings now?" "Do you have a plan on how you would do this?"     no 8. TREATMENT:  "What has been done so far to treat this  anxiety?" (e.g., medicines, relaxation strategies). "What has helped?"     Deep breathing- walk pacing 9. TREATMENT - THERAPIST: "Do you have a counselor or therapist? Name?"     no 11. PATIENT SUPPORT: "Who is with you now?" "Who do you live with?" "Do you have family or friends who you can talk to?"        Family friends 12. OTHER SYMPTOMS: "Do you have any other symptoms?" (e.g., feeling depressed, trouble concentrating, trouble sleeping, trouble breathing, palpitations or fast heartbeat, chest pain, sweating, nausea, or diarrhea)       Vomiting from anxiety 13. PREGNANCY: "Is there any chance you are pregnant?" "When was your last menstrual period?"       N/a  Protocols used: Anxiety and Panic Attack-A-AH

## 2023-03-26 NOTE — Telephone Encounter (Signed)
Understood. She can keep her apt for tomorrow and we will discuss her anxiety concerns at that apt  Saralyn Pilar, DO [facility] 03/26/2023, 12:04 PM

## 2023-03-27 ENCOUNTER — Encounter: Payer: Self-pay | Admitting: Family Medicine

## 2023-03-27 ENCOUNTER — Telehealth: Payer: Federal, State, Local not specified - PPO | Admitting: Family Medicine

## 2023-03-27 DIAGNOSIS — E8941 Symptomatic postprocedural ovarian failure: Secondary | ICD-10-CM

## 2023-03-27 DIAGNOSIS — R232 Flushing: Secondary | ICD-10-CM

## 2023-03-27 DIAGNOSIS — F411 Generalized anxiety disorder: Secondary | ICD-10-CM

## 2023-03-27 DIAGNOSIS — F410 Panic disorder [episodic paroxysmal anxiety] without agoraphobia: Secondary | ICD-10-CM | POA: Diagnosis not present

## 2023-03-27 MED ORDER — GABAPENTIN 100 MG PO CAPS: ORAL_CAPSULE | ORAL | 2 refills | Status: DC

## 2023-03-27 NOTE — Patient Instructions (Addendum)
Thank you for coming to the office today.  Annual physical in February 2025 with labs, contact us to schedule when ready.  Keep Venlafaxine  Try Gabapentin 100mg  nightly for hot flashes and anxiety, dose increase by 1 extra pill every few nights, up to max of 3 = 300mg , let me know if this works, we can order the 300mg  in one pill or higher.  [facility] Scientist, water quality Available) [place] [address] [place], [place] [address] Phone: [phone]  DUE for FASTING BLOOD WORK (no food or drink after midnight before the lab appointment, only water or coffee without cream/sugar on the morning of)  SCHEDULE "Lab Only" visit in the morning at the clinic for lab draw in 4 MONTHS   - Make sure Lab Only appointment is at about 1 week before your next appointment, so that results will be available  For Lab Results, once available within 2-3 days of blood draw, you can can log in to MyChart online to view your results and a brief explanation. Also, we can discuss results at next follow-up visit.   Please schedule a Follow-up Appointment to: Return in about 3 months (around 06/27/2023) for 3 month fasting lab only then 1 week later Annual Physical.  If you have any other questions or concerns, please feel free to call the office or send a message through MyChart. You may also schedule an earlier appointment if necessary.  Additionally, you may be receiving a survey about your experience at our office within a few days to 1 week by e-mail or mail. We value your feedback.  Saralyn Pilar, DO [facility], [place]

## 2023-03-27 NOTE — Progress Notes (Signed)
Subjective:    Patient ID: Sharon Ponce, female    DOB: 05/06/1971, 52 y.o.   MRN: [id number]  Sharon Ponce is a 52 y.o. female presenting on 03/27/2023 for No chief complaint on file.  Virtual / Telehealth Encounter - Video Visit via MyChart The purpose of this virtual visit is to provide medical care while limiting exposure to the novel coronavirus (COVID19) for both patient and office staff.  Consent was obtained for remote visit:  Yes.   Answered questions that patient had about telehealth interaction:  Yes.   I discussed the limitations, risks, security and privacy concerns of performing an evaluation and management service by video/telephone. I also discussed with the patient that there may be a patient responsible charge related to this service. The patient expressed understanding and agreed to proceed.  Patient Location: Home Provider Location: [facility] (Office)  Participants in virtual visit: - Patient: Sharon Ponce - CMA: Shirley Muscat CMA - Provider: Dr Althea Charon   HPI  Discussed the use of AI scribe software for clinical note transcription with the patient, who gave verbal consent to proceed.  Generalized Anxiety with Panic Attacks  The patient, with a history of anxiety and panic attacks, reports an exacerbation of symptoms in the context of recent political election and job insecurity. She describes experiencing three severe panic attacks, one of which led to vomiting. The patient also reports frequent crying episodes, which she attributes to anxiety rather than depression. She denies feelings of hopelessness or suicidal ideation. Instead, she expresses anger and a sense of grief over the current situation.  The patient has been isolating herself due to concerns about potential confrontations that could trigger her irritability and anxiety. She has taken time off work to cope with her emotional state and has been relying on her support system, including  her father and friends.  She is currently taking Venlafaxine XR 150mg  daily and Restoril for sleep and finds it effective. She has a history of taking Xanax for panic attacks but expresses a strong dislike for the medication class of benzodiazpine. She is open to trying new medications to manage her symptoms.  The patient also expresses interest in speaking with a counselor to help process her emotions. She has a history of being diagnosed with panic attacks by the [place] and has previously been prescribed gabapentin, which she is open to trying again.   Hot Flashes, vasomotor / s/p surgical menopause  In addition to her anxiety, the patient reports severe hot flashes, which she attributes to menopause following a surgery to stop her menstrual cycle. The hot flashes are so severe that they often wake the patient from sleep, necessitating a change of clothes and sometimes a shower. - Already on SNRI Venlafaxine   Past Surgical History:  Procedure Laterality Date   BUNIONECTOMY     DILITATION & CURRETTAGE/HYSTROSCOPY WITH NOVASURE ABLATION N/A 10/31/2020   Procedure: DILATATION & CURETTAGE/HYSTEROSCOPY WITH NOVASURE ABLATION;  Surgeon: Conard Novak, MD;  Location: [facility] ORS;  Service: Gynecology;  Laterality: N/A;   PLACEMENT OF BREAST IMPLANTS  2005   TOOTH EXTRACTION  2012        03/27/2023   11:50 AM 11/28/2021    3:17 PM 05/22/2021    3:31 PM  Depression screen PHQ 2/9  Decreased Interest 0 2 0  Down, Depressed, Hopeless 0 1 0  PHQ - 2 Score 0 3 0  Altered sleeping 0 0 2  Tired, decreased energy 0 1 1  Change in appetite 0 0 1  Feeling bad or failure about yourself  0 1 0  Trouble concentrating 0 2 1  Moving slowly or fidgety/restless 0 1 0  Suicidal thoughts 0 0 0  PHQ-9 Score 0 8 5  Difficult doing work/chores Somewhat difficult Somewhat difficult Not difficult at all      03/27/2023   11:51 AM 11/28/2021    3:17 PM 05/22/2021    3:31 PM 03/17/2020   11:46 AM  GAD 7 :  Generalized Anxiety Score  Nervous, Anxious, on Edge 2 1 1 1   Control/stop worrying 2 0 0 1  Worry too much - different things 3 0 1 1  Trouble relaxing 1 1 1 1   Restless 1 1 1 1   Easily annoyed or irritable 3 1 0 1  Afraid - awful might happen 2 0 0 1  Total GAD 7 Score 14 4 4 7   Anxiety Difficulty Very difficult Somewhat difficult Not difficult at all Somewhat difficult     Social History   Tobacco Use   Smoking status: Some Days    Current packs/day: 0.25    Average packs/day: 0.3 packs/day for 20.0 years (5.0 ttl pk-yrs)    Types: Cigarettes   Smokeless tobacco: Never   Tobacco comments:    some days  Vaping Use   Vaping status: Never Used  Substance Use Topics   Alcohol use: Never   Drug use: Never    Review of Systems Per HPI unless specifically indicated above     Objective:    There were no vitals taken for this visit.  Wt Readings from Last 3 Encounters:  06/21/22 152 lb 9.6 oz (69.2 kg)  02/06/22 163 lb 9.6 oz (74.2 kg)  11/28/21 173 lb (78.5 kg)    Physical Exam  Note examination was completely remotely via video observation objective data only  Gen - well-appearing, no acute distress or apparent pain, comfortable HEENT - eyes appear clear without discharge or redness Heart/Lungs - cannot examine virtually Abd - cannot examine virtually  Skin - face visible today- no rash Neuro - awake, alert, oriented Psych - anxious appearing, mood is normal   Results for orders placed or performed in visit on 06/21/22  COMPLETE METABOLIC PANEL WITH GFR  Result Value Ref Range   Glucose, Bld 81 65 - 99 mg/dL   BUN 14 7 - 25 mg/dL   Creat 4.09 8.11 - 9.14 mg/dL   eGFR 92 > OR = 60 NW/GNF/6.21H0   BUN/Creatinine Ratio SEE NOTE: 6 - 22 (calc)   Sodium 137 135 - 146 mmol/L   Potassium 4.7 3.5 - 5.3 mmol/L   Chloride 104 98 - 110 mmol/L   CO2 26 20 - 32 mmol/L   Calcium 9.5 8.6 - 10.4 mg/dL   Total Protein 7.2 6.1 - 8.1 g/dL   Albumin 4.2 3.6 - 5.1 g/dL    Globulin 3.0 1.9 - 3.7 g/dL (calc)   AG Ratio 1.4 1.0 - 2.5 (calc)   Total Bilirubin 0.4 0.2 - 1.2 mg/dL   Alkaline phosphatase (APISO) 56 37 - 153 U/L   AST 15 10 - 35 U/L   ALT 9 6 - 29 U/L  CBC with Differential/Platelet  Result Value Ref Range   WBC 9.1 3.8 - 10.8 Thousand/uL   RBC 4.32 3.80 - 5.10 Million/uL   Hemoglobin 13.8 11.7 - 15.5 g/dL   HCT 86.5 78.4 - 69.6 %   MCV 91.9 80.0 - 100.0 fL  MCH 31.9 27.0 - 33.0 pg   MCHC 34.8 32.0 - 36.0 g/dL   RDW 69.6 29.5 - 28.4 %   Platelets 287 140 - 400 Thousand/uL   MPV 10.5 7.5 - 12.5 fL   Neutro Abs 4,541 1,500 - 7,800 cells/uL   Lymphs Abs 3,531 850 - 3,900 cells/uL   Absolute Monocytes 464 200 - 950 cells/uL   Eosinophils Absolute 482 15 - 500 cells/uL   Basophils Absolute 82 0 - 200 cells/uL   Neutrophils Relative % 49.9 %   Total Lymphocyte 38.8 %   Monocytes Relative 5.1 %   Eosinophils Relative 5.3 %   Basophils Relative 0.9 %  Lipid panel  Result Value Ref Range   Cholesterol 244 (H) <200 mg/dL   HDL 67 > OR = 50 mg/dL   Triglycerides 64 <132 mg/dL   LDL Cholesterol (Calc) 161 (H) mg/dL (calc)   Total CHOL/HDL Ratio 3.6 <5.0 (calc)   Non-HDL Cholesterol (Calc) 177 (H) <130 mg/dL (calc)  Hemoglobin G4W  Result Value Ref Range   Hgb A1c MFr Bld 5.0 <5.7 % of total Hgb   Mean Plasma Glucose 97 mg/dL   eAG (mmol/L) 5.4 mmol/L  TSH  Result Value Ref Range   TSH 1.46 mIU/L  VITAMIN D 25 Hydroxy (Vit-D Deficiency, Fractures)  Result Value Ref Range   Vit D, 25-Hydroxy 41 30 - 100 ng/mL  Vitamin B12  Result Value Ref Range   Vitamin B-12 1,427 (H) 200 - 1,100 pg/mL      Assessment & Plan:   Problem List Items Addressed This Visit     Generalized anxiety disorder with panic attacks - Primary   Relevant Medications   gabapentin (NEURONTIN) 100 MG capsule   Hot flashes due to surgical menopause   Relevant Medications   gabapentin (NEURONTIN) 100 MG capsule   Other Visit Diagnoses     Vasomotor flushing        Relevant Medications   gabapentin (NEURONTIN) 100 MG capsule          Generalized Anxiety and Panic Attacks Increased anxiety and panic attacks related to 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  Patient has a history of these conditions and is currently experiencing heightened symptoms, including anger and agoraphobia response. Patient is self-aware and is taking steps to manage her reactions, including distancing herself from potential triggers.  Continue Venlafaxine XR 150mg  daily SNRI Avoid BDZ therapy aside from her Restoril for insomnia Discussed possible Buspar 5-10mg  THREE TIMES A DAY AS NEEDED anxiety. She is interested in this med, but we chose an alternative option today Add Gabapentin 100mg  up to 300mg  dose titration at bedtime for hot flashes + anxiety can take dosing during day if tolerating as well Notify us for dose adjustment higher mg if indicated and effective  Recommend CBT therapy through [facility] virtual option preferred. We can consider Psych referral to [facility] if interested as well  Surgical Menopausal Hot Flashes Severe hot flashes disrupting sleep and daily activities. Patient is post-menopausal following surgery to stop menstrual cycle. On SNRI venlafaxine w/o relief for this issue -Start gabapentin for hot flashes, using the same dosing regimen as for anxiety listed above  General Health Maintenance -Annual physical and labs scheduled for February.       Meds ordered this encounter  Medications   gabapentin (NEURONTIN) 100 MG capsule    Sig: Start 1 capsule daily at bedtime for hot flashes and anxiety, increase by  1 cap every 3 days as tolerated up to 3 pills = 300mg  nightly max dose.    Dispense:  90 capsule    Refill:  2      Follow up plan: Return in about 3 months (around 06/27/2023) for 3 month fasting lab only then 1 week later Annual Physical.  Patient verbalizes understanding with the  above medical recommendations including the limitation of remote medical advice.  Specific follow-up and call-back criteria were given for patient to follow-up or seek medical care more urgently if needed.  Total duration of direct patient care provided via video conference: 23 minutes   Saralyn Pilar, DO [facility] 03/27/2023, 11:47 AM

## 2023-06-11 ENCOUNTER — Other Ambulatory Visit: Payer: Self-pay | Admitting: Family Medicine

## 2023-06-11 DIAGNOSIS — N921 Excessive and frequent menstruation with irregular cycle: Secondary | ICD-10-CM

## 2023-06-11 DIAGNOSIS — F419 Anxiety disorder, unspecified: Secondary | ICD-10-CM

## 2023-06-11 DIAGNOSIS — R9389 Abnormal findings on diagnostic imaging of other specified body structures: Secondary | ICD-10-CM

## 2023-06-11 DIAGNOSIS — G43719 Chronic migraine without aura, intractable, without status migrainosus: Secondary | ICD-10-CM

## 2023-06-11 DIAGNOSIS — F5101 Primary insomnia: Secondary | ICD-10-CM

## 2023-06-11 NOTE — Telephone Encounter (Signed)
Requested medication (s) are due for refill today: yes  Requested medication (s) are on the active medication list: yes  Last refill:  01/13/23  Future visit scheduled: yes  Notes to clinic:  Unable to refill per protocol, cannot delegate.      Requested Prescriptions  Pending Prescriptions Disp Refills   temazepam (RESTORIL) 15 MG capsule 180 capsule 1    Sig: TAKE 1 TO 2 CAPSULES(15 TO 30 MG) BY MOUTH AT BEDTIME AS NEEDED FOR SLEEP     Not Delegated - Psychiatry: Anxiolytics/Hypnotics 2 Failed - 06/11/2023  3:55 PM      Failed - This refill cannot be delegated      Failed - Urine Drug Screen completed in last 360 days      Passed - Patient is not pregnant      Passed - Valid encounter within last 6 months    Recent Outpatient Visits           2 months ago Generalized anxiety disorder with panic attacks   [facility] Smitty Cords, DO   11 months ago Annual physical exam   Sharon Ponce, [name], DO   1 year ago Obesity (BMI 30.0-34.9)   Sharon Ponce, [name], DO   1 year ago Obesity (BMI 30.0-34.9)   Sharon Ponce, [name], DO   2 years ago Primary insomnia   Sharon Ponce, [name], DO       Future Appointments             In 3 weeks Althea Ponce, [name], DO Sharon Ponce, [facility]             venlafaxine XR (EFFEXOR-XR) 150 MG 24 hr capsule 90 capsule 3    Sig: Take 1 capsule (150 mg total) by mouth at bedtime.     Psychiatry: Antidepressants - SNRI - desvenlafaxine & venlafaxine Failed - 06/11/2023  3:55 PM      Failed - Lipid Panel in normal range within the last 12 months    Cholesterol  Date Value Ref Range Status  06/21/2022 244 (H) <200 mg/dL Final   LDL Cholesterol (Calc)  Date Value Ref Range Status  06/21/2022 161  (H) mg/dL (calc) Final    Comment:    Reference range: <100 . Desirable range <100 mg/dL for primary prevention;   <70 mg/dL for patients with CHD or diabetic patients  with > or = 2 CHD risk factors. Marland Kitchen LDL-C is now calculated using the Martin-Hopkins  calculation, which is a validated novel method providing  better accuracy than the Friedewald equation in the  estimation of LDL-C.  Horald Pollen et al. Lenox Ahr. 9528;413(24): 2061-2068  (http://education.[employer].com/faq/FAQ164)    HDL  Date Value Ref Range Status  06/21/2022 67 > OR = 50 mg/dL Final   Triglycerides  Date Value Ref Range Status  06/21/2022 64 <150 mg/dL Final         Passed - Cr in normal range and within 360 days    Creat  Date Value Ref Range Status  06/21/2022 0.77 0.50 - 1.03 mg/dL Final         Passed - Last BP in normal range    BP Readings from Last 1 Encounters:  06/21/22 118/74         Passed - Valid encounter within last 6 months  Recent Outpatient Visits           2 months ago Generalized anxiety disorder with panic attacks   Sharon Ponce, [name]   11 months ago Annual physical exam   Sharon Ponce, [name], DO   1 year ago Obesity (BMI 30.0-34.9)   Sharon Ponce, [name], DO   1 year ago Obesity (BMI 30.0-34.9)   Sharon Ponce, [name], DO   2 years ago Primary insomnia   Sharon Ponce, [name], DO       Future Appointments             In 3 weeks Althea Ponce, [name], DO [name], [name]

## 2023-06-11 NOTE — Telephone Encounter (Signed)
Medication Refill -  Most Recent Primary Care Visit:  Provider: Smitty Cords  Department: [facility]  Visit Type: MYCHART VIDEO VISIT  Date: 03/27/2023  Medication:  temazepam (RESTORIL) 15 MG capsule  venlafaxine XR (EFFEXOR-XR) 150 MG 24 hr capsule   Has the patient contacted their pharmacy? No (Agent: If no, request that the patient contact the pharmacy for the refill. If patient does not wish to contact the pharmacy document the reason why and proceed with request.) (Agent: If yes, when and what did the pharmacy advise?)  Is this the correct pharmacy for this prescription? Yes If no, delete pharmacy and type the correct one.  This is the patient's preferred pharmacy:  [facility] [address]), [place] - [address] [place]) [place] [address] Phone: [phone] Fax: [phone]   Has the prescription been filled recently? Yes  Is the patient out of the medication? Yes  Has the patient been seen for an appointment in the last year OR does the patient have an upcoming appointment? Yes  Can we respond through MyChart? Yes  Agent: Please be advised that Rx refills may take up to 3 business days. We ask that you follow-up with your pharmacy.

## 2023-06-12 MED ORDER — TEMAZEPAM 15 MG PO CAPS: ORAL_CAPSULE | ORAL | 5 refills | Status: DC

## 2023-06-12 MED ORDER — VENLAFAXINE HCL ER 150 MG PO CP24: 150.0000 mg | ORAL_CAPSULE | Freq: Every day | ORAL | 3 refills | Status: DC

## 2023-07-04 ENCOUNTER — Encounter: Payer: Federal, State, Local not specified - PPO | Admitting: Family Medicine

## 2023-07-08 ENCOUNTER — Ambulatory Visit: Payer: Federal, State, Local not specified - PPO | Admitting: Family Medicine

## 2023-07-08 VITALS — BP 110/70 | HR 66 | Ht 63.5 in | Wt 125.0 lb

## 2023-07-08 DIAGNOSIS — F410 Panic disorder [episodic paroxysmal anxiety] without agoraphobia: Secondary | ICD-10-CM

## 2023-07-08 DIAGNOSIS — E559 Vitamin D deficiency, unspecified: Secondary | ICD-10-CM

## 2023-07-08 DIAGNOSIS — F411 Generalized anxiety disorder: Secondary | ICD-10-CM

## 2023-07-08 DIAGNOSIS — E66811 Obesity, class 1: Secondary | ICD-10-CM

## 2023-07-08 DIAGNOSIS — E8941 Symptomatic postprocedural ovarian failure: Secondary | ICD-10-CM | POA: Diagnosis not present

## 2023-07-08 DIAGNOSIS — Z1211 Encounter for screening for malignant neoplasm of colon: Secondary | ICD-10-CM | POA: Diagnosis not present

## 2023-07-08 DIAGNOSIS — Z0000 Encounter for general adult medical examination without abnormal findings: Secondary | ICD-10-CM | POA: Diagnosis not present

## 2023-07-08 DIAGNOSIS — R232 Flushing: Secondary | ICD-10-CM

## 2023-07-08 DIAGNOSIS — R7309 Other abnormal glucose: Secondary | ICD-10-CM

## 2023-07-08 DIAGNOSIS — F5101 Primary insomnia: Secondary | ICD-10-CM

## 2023-07-08 DIAGNOSIS — E538 Deficiency of other specified B group vitamins: Secondary | ICD-10-CM

## 2023-07-08 NOTE — Patient Instructions (Addendum)
 Thank you for coming to the office today.  Refills should be good for 6 months, check with the pharmacy, let me know. Message if need any re order.   Colon Cancer Screening: Ordered the Cologuard (home kit) test for colon cancer screening. Stay tuned for further updates.  It will be shipped to you directly. If not received in 2-4 weeks, call us or the company.   If you send it back and no results are received in 2-4 weeks, call us or the company as well!   Colon Cancer Screening: - For all adults age 85+ routine colon cancer screening is highly recommended.     - Recent guidelines from American Cancer Society recommend starting age of 58 - Early detection of colon cancer is important, because often there are no warning signs or symptoms, also if found early usually it can be cured. Late stage is hard to treat.   - If Cologuard is NEGATIVE, then it is good for 3 years before next due - If Cologuard is POSITIVE, then it is strongly advised to get a Colonoscopy, which allows the GI doctor to locate the source of the cancer or polyp (even very early stage) and treat it by removing it. ------------------------- Follow instructions to collect sample, you may call the company for any help or questions, 24/7 telephone support at [phone].   Please schedule a Follow-up Appointment to: Return in about 6 months (around 01/05/2024) for 6 month Mychart virtual video Anxiety, hot flashes, updates.  If you have any other questions or concerns, please feel free to call the office or send a message through MyChart. You may also schedule an earlier appointment if necessary.  Additionally, you may be receiving a survey about your experience at our office within a few days to 1 week by e-mail or mail. We value your feedback.  Saralyn Pilar, DO [facility], [place]

## 2023-07-08 NOTE — Progress Notes (Unsigned)
 Subjective:    Patient ID: Sharon Ponce, female    DOB: 05-21-1970, 53 y.o.   MRN: [id number]  Sharon Ponce is a 53 y.o. female presenting on 07/08/2023 for Annual Exam   HPI  Discussed the use of AI scribe software for clinical note transcription with the patient, who gave verbal consent to proceed.  History of Present Illness   Sharon Ponce is a 53 year old female who presents for an annual physical exam.  She has lost over 25 pounds in the past year, reducing her weight from 152 pounds to 125 pounds, attributing this to improved nutrition and dietary habits.  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 She is concerned about potential job loss and has been saving money in case she loses her primary employment. 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  She has not had a mammogram yet due to financial constraints but performs regular self-breast exams. She is due for a cervical cancer screening and is planning to schedule a Pap smear soon.  She has been taking a supplement called Nutrafol Women's Balance for about two weeks, which she reports has stopped her hot flashes and helped regrow her hair after weight loss. She has not experienced any hot flashes since starting the supplement.  She is currently not taking gabapentin as she does not feel the need for it anymore. She continues to have rizatriptan for migraines and temazepam for sleep, which she picked up recently and has refills available. She is managing her medications well and does not require any changes at this time.  Health Maintenance:   Plan on 2025 - female provider / Nicki Reaper, FNP - pap smear   Due for Mammogram update   Due for Colon CA Screen, willing to pursue Cologuard       07/08/2023    3:47 PM 03/27/2023   11:50 AM 11/28/2021    3:17 PM  Depression screen PHQ 2/9  Decreased Interest 0 0 2  Down, Depressed, Hopeless 0 0 1   PHQ - 2 Score 0 0 3  Altered sleeping 1 0 0  Tired, decreased energy 1 0 1  Change in appetite 0 0 0  Feeling bad or failure about yourself  0 0 1  Trouble concentrating 1 0 2  Moving slowly or fidgety/restless 0 0 1  Suicidal thoughts 0 0 0  PHQ-9 Score 3 0 8  Difficult doing work/chores Somewhat difficult Somewhat difficult Somewhat difficult       07/08/2023    3:48 PM 03/27/2023   11:51 AM 11/28/2021    3:17 PM 05/22/2021    3:31 PM  GAD 7 : Generalized Anxiety Score  Nervous, Anxious, on Edge 3 2 1 1   Control/stop worrying 3 2 0 0  Worry too much - different things 1 3 0 1  Trouble relaxing 3 1 1 1   Restless 3 1 1 1   Easily annoyed or irritable 3 3 1  0  Afraid - awful might happen 3 2 0 0  Total GAD 7 Score 19 14 4 4   Anxiety Difficulty Extremely difficult Very difficult Somewhat difficult Not difficult at all     Past Medical History:  Diagnosis Date   Allergy    Anxiety    Migraine    Past Surgical History:  Procedure Laterality Date   BUNIONECTOMY     DILITATION & CURRETTAGE/HYSTROSCOPY WITH NOVASURE ABLATION  N/A 10/31/2020   Procedure: DILATATION & CURETTAGE/HYSTEROSCOPY WITH NOVASURE ABLATION;  Surgeon: Conard Novak, MD;  Location: [facility];  Service: Gynecology;  Laterality: N/A;   PLACEMENT OF BREAST IMPLANTS  2005   TOOTH EXTRACTION  2012   Social History   Socioeconomic History   Marital status: Single    Spouse name: Not on file   Number of children: Not on file   Years of education: Not on file   Highest education level: Master's degree (e.g., MA, MS, MEng, MEd, MSW, MBA)  Occupational History   Occupation: Agricultural engineer: [employer]  Tobacco Use   Smoking status: Some Days    Current packs/day: 0.25    Average packs/day: 0.3 packs/day for 20.0 years (5.0 ttl pk-yrs)    Types: Cigarettes   Smokeless tobacco: Never   Tobacco comments:    some days  Vaping Use   Vaping status: Never Used  Substance and Sexual Activity   Alcohol use:  Never   Drug use: Never   Sexual activity: Not Currently    Birth control/protection: None  Other Topics Concern   Not on file  Social History Narrative   Lives alone    Right handed   Caffeine: approx 6 cups/day   Social Drivers of Corporate investment banker Strain: Low Risk  (07/08/2023)   Overall Financial Resource Strain (CARDIA)    Difficulty of Paying Living Expenses: Not hard at all  Food Insecurity: No Food Insecurity (07/08/2023)   Hunger Vital Sign    Worried About Running Out of Food in the Last Year: Never true    Ran Out of Food in the Last Year: Never true  Transportation Needs: No Transportation Needs (07/08/2023)   PRAPARE - Administrator, Civil Service (Medical): No    Lack of Transportation (Non-Medical): No  Physical Activity: Insufficiently Active (07/08/2023)   Exercise Vital Sign    Days of Exercise per Week: 2 days    Minutes of Exercise per Session: 20 min  Stress: Stress Concern Present (07/08/2023)   [employer] of Occupational Health - Occupational Stress Questionnaire    Feeling of Stress : To some extent  Social Connections: Socially Isolated (07/08/2023)   Social Connection and Isolation Panel [NHANES]    Frequency of Communication with Friends and Family: More than three times a week    Frequency of Social Gatherings with Friends and Family: Once a week    Attends Religious Services: Never    Database administrator or Organizations: No    Attends Engineer, structural: Not on file    Marital Status: Divorced  Intimate Partner Violence: Not At Risk (05/04/2018)   Humiliation, Afraid, Rape, and Kick questionnaire    Fear of Current or Ex-Partner: No    Emotionally Abused: No    Physically Abused: No    Sexually Abused: No   Family History  Problem Relation Age of Onset   Healthy Mother    Healthy Father    Migraines Father    Leukemia Maternal Grandmother    Heart disease Maternal Grandfather    Vision loss  Paternal Aunt    Migraines Maternal Aunt    Breast cancer Neg Hx    Colon cancer Neg Hx    Stroke Neg Hx    Ovarian cancer Neg Hx    Current Outpatient Medications on File Prior to Visit  Medication Sig   acidophilus (RISAQUAD) CAPS capsule Take 1 capsule by mouth  daily.   B Complex-C (SUPER B COMPLEX PO) Take 1 tablet by mouth daily.   CRANBERRY PO Take 1 tablet by mouth daily.   ELDERBERRY PO Take 1 capsule by mouth daily.   melatonin 5 MG TABS Take 5 mg by mouth at bedtime.   Multiple Vitamin (MULTIVITAMIN) tablet Take 1 tablet by mouth daily.   Nutritional Supplements (DHEA PO) Take 1 capsule by mouth daily.   Omega-3 Fatty Acids (OMEGA-3 FISH OIL PO) Take 1 capsule by mouth daily.   rizatriptan (MAXALT-MLT) 10 MG disintegrating tablet Take 1 tablet (10 mg total) by mouth as needed for migraine. May repeat in 2 hours if needed   temazepam (RESTORIL) 15 MG capsule TAKE 1 TO 2 CAPSULES(15 TO 30 MG) BY MOUTH AT BEDTIME AS NEEDED FOR SLEEP   venlafaxine XR (EFFEXOR-XR) 150 MG 24 hr capsule Take 1 capsule (150 mg total) by mouth at bedtime.   No current facility-administered medications on file prior to visit.    Review of Systems  Constitutional:  Negative for activity change, appetite change, chills, diaphoresis, fatigue and fever.  HENT:  Negative for congestion and hearing loss.   Eyes:  Negative for visual disturbance.  Respiratory:  Negative for cough, chest tightness, shortness of breath and wheezing.   Cardiovascular:  Negative for chest pain, palpitations and leg swelling.  Gastrointestinal:  Negative for abdominal pain, constipation, diarrhea, nausea and vomiting.  Genitourinary:  Negative for dysuria, frequency and hematuria.  Musculoskeletal:  Negative for arthralgias and neck pain.  Skin:  Negative for rash.  Neurological:  Negative for dizziness, weakness, light-headedness, numbness and headaches.  Hematological:  Negative for adenopathy.  Psychiatric/Behavioral:   Positive for sleep disturbance. Negative for behavioral problems and dysphoric mood. The patient is nervous/anxious.    Per HPI unless specifically indicated above     Objective:    BP 110/70   Pulse 66   Ht 5' 3.5" (1.613 m)   Wt 125 lb (56.7 kg)   SpO2 94%   BMI 21.80 kg/m   Wt Readings from Last 3 Encounters:  07/08/23 125 lb (56.7 kg)  06/21/22 152 lb 9.6 oz (69.2 kg)  02/06/22 163 lb 9.6 oz (74.2 kg)    Physical Exam Vitals and nursing note reviewed.  Constitutional:      General: She is not in acute distress.    Appearance: She is well-developed. She is not diaphoretic.     Comments: Well-appearing, comfortable, cooperative  HENT:     Head: Normocephalic and atraumatic.  Eyes:     General:        Right eye: No discharge.        Left eye: No discharge.     Conjunctiva/sclera: Conjunctivae normal.     Pupils: Pupils are equal, round, and reactive to light.  Neck:     Thyroid: No thyromegaly.  Cardiovascular:     Rate and Rhythm: Normal rate and regular rhythm.     Pulses: Normal pulses.     Heart sounds: Normal heart sounds. No murmur heard. Pulmonary:     Effort: Pulmonary effort is normal. No respiratory distress.     Breath sounds: Normal breath sounds. No wheezing or rales.  Abdominal:     General: Bowel sounds are normal. There is no distension.     Palpations: Abdomen is soft. There is no mass.     Tenderness: There is no abdominal tenderness.  Musculoskeletal:        General: No tenderness. Normal range of  motion.     Cervical back: Normal range of motion and neck supple.     Right lower leg: No edema.     Left lower leg: No edema.     Comments: Upper / Lower Extremities: - Normal muscle tone, strength bilateral upper extremities 5/5, lower extremities 5/5  Lymphadenopathy:     Cervical: No cervical adenopathy.  Skin:    General: Skin is warm and dry.     Findings: No erythema or rash.  Neurological:     Mental Status: She is alert and oriented to  person, place, and time.     Comments: Distal sensation intact to light touch all extremities  Psychiatric:        Mood and Affect: Mood normal.        Behavior: Behavior normal.        Thought Content: Thought content normal.     Comments: Well groomed, good eye contact, normal speech and thoughts     Results for orders placed or performed in visit on 06/21/22  COMPLETE METABOLIC PANEL WITH GFR   Collection Time: 06/21/22  2:27 PM  Result Value Ref Range   Glucose, Bld 81 65 - 99 mg/dL   BUN 14 7 - 25 mg/dL   Creat 1.61 0.96 - 0.45 mg/dL   eGFR 92 > OR = 60 WU/JWJ/1.91Y7   BUN/Creatinine Ratio SEE NOTE: 6 - 22 (calc)   Sodium 137 135 - 146 mmol/L   Potassium 4.7 3.5 - 5.3 mmol/L   Chloride 104 98 - 110 mmol/L   CO2 26 20 - 32 mmol/L   Calcium 9.5 8.6 - 10.4 mg/dL   Total Protein 7.2 6.1 - 8.1 g/dL   Albumin 4.2 3.6 - 5.1 g/dL   Globulin 3.0 1.9 - 3.7 g/dL (calc)   AG Ratio 1.4 1.0 - 2.5 (calc)   Total Bilirubin 0.4 0.2 - 1.2 mg/dL   Alkaline phosphatase (APISO) 56 37 - 153 U/L   AST 15 10 - 35 U/L   ALT 9 6 - 29 U/L  CBC with Differential/Platelet   Collection Time: 06/21/22  2:27 PM  Result Value Ref Range   WBC 9.1 3.8 - 10.8 Thousand/uL   RBC 4.32 3.80 - 5.10 Million/uL   Hemoglobin 13.8 11.7 - 15.5 g/dL   HCT 82.9 56.2 - 13.0 %   MCV 91.9 80.0 - 100.0 fL   MCH 31.9 27.0 - 33.0 pg   MCHC 34.8 32.0 - 36.0 g/dL   RDW 86.5 78.4 - 69.6 %   Platelets 287 140 - 400 Thousand/uL   MPV 10.5 7.5 - 12.5 fL   Neutro Abs 4,541 1,500 - 7,800 cells/uL   Lymphs Abs 3,531 850 - 3,900 cells/uL   Absolute Monocytes 464 200 - 950 cells/uL   Eosinophils Absolute 482 15 - 500 cells/uL   Basophils Absolute 82 0 - 200 cells/uL   Neutrophils Relative % 49.9 %   Total Lymphocyte 38.8 %   Monocytes Relative 5.1 %   Eosinophils Relative 5.3 %   Basophils Relative 0.9 %  Lipid panel   Collection Time: 06/21/22  2:27 PM  Result Value Ref Range   Cholesterol 244 (H) <200 mg/dL   HDL  67 > OR = 50 mg/dL   Triglycerides 64 <295 mg/dL   LDL Cholesterol (Calc) 161 (H) mg/dL (calc)   Total CHOL/HDL Ratio 3.6 <5.0 (calc)   Non-HDL Cholesterol (Calc) 177 (H) <130 mg/dL (calc)  Hemoglobin M8U   Collection Time: 06/21/22  2:27 PM  Result Value Ref Range   Hgb A1c MFr Bld 5.0 <5.7 % of total Hgb   Mean Plasma Glucose 97 mg/dL   eAG (mmol/L) 5.4 mmol/L  TSH   Collection Time: 06/21/22  2:27 PM  Result Value Ref Range   TSH 1.46 mIU/L  VITAMIN D 25 Hydroxy (Vit-D Deficiency, Fractures)   Collection Time: 06/21/22  2:27 PM  Result Value Ref Range   Vit D, 25-Hydroxy 41 30 - 100 ng/mL  Vitamin B12   Collection Time: 06/21/22  2:27 PM  Result Value Ref Range   Vitamin B-12 1,427 (H) 200 - 1,100 pg/mL      Assessment & Plan:   Problem List Items Addressed This Visit     Generalized anxiety disorder with panic attacks   Relevant Orders   COMPLETE METABOLIC PANEL WITH GFR   Hot flashes due to surgical menopause   Primary insomnia   Relevant Orders   COMPLETE METABOLIC PANEL WITH GFR   CBC with Differential/Platelet   Other Visit Diagnoses       Annual physical exam    -  Primary   Relevant Orders   TSH   Lipid panel   Hemoglobin A1c   COMPLETE METABOLIC PANEL WITH GFR   CBC with Differential/Platelet     Screening for colon cancer       Relevant Orders   Cologuard     Vasomotor flushing       Relevant Orders   COMPLETE METABOLIC PANEL WITH GFR     Vitamin B12 deficiency       Relevant Orders   Vitamin B12     Vitamin D deficiency       Relevant Orders   VITAMIN D 25 Hydroxy (Vit-D Deficiency, Fractures)     Obesity (BMI 30.0-34.9)       Relevant Orders   TSH   Lipid panel   Hemoglobin A1c   COMPLETE METABOLIC PANEL WITH GFR   CBC with Differential/Platelet     Abnormal glucose       Relevant Orders   Hemoglobin A1c        Updated Health Maintenance information Upcoming fasting labs 2/21 Encouraged improvement to lifestyle with diet and  exercise Goal maintain weight loss  Weight Management Significant weight loss of over 25 pounds in the past year. -Continue current diet and exercise regimen.  Cancer Screening Discussed mammogram, Pap smear, and Cologuard for colon cancer screening. -Order mammogram when ready -Schedule Pap smear with Rene Kocher for 07/17/2023. -Order Cologuard for colon cancer screening.  Menopausal Symptoms Hot flashes resolved with current supplement regimen. -Continue current supplement regimen. -Check-in in 6 months to reassess symptoms.  Insomnia Temazepam (Restoril) for sleep. -Continue Temazepam as needed for sleep. Should have up to 6 month supply w/ refills -Check with pharmacy for refills and message doctor directly if needed before next appointment.  Muscle Strain Discussed possible muscle strain in the chest wall. -Recommend heat, muscle rub, and range of motion exercises for relief.  General Health Maintenance / Followup Plans -Complete blood work on 07/11/2023. -Schedule follow-up appointment in 6 months. -Consider heart scan for coronary artery disease screening in the future, but not a high priority at this time given lower risk       Orders Placed This Encounter  Procedures   Cologuard   TSH    Standing Status:   Future    Expected Date:   07/11/2023    Expiration Date:   07/08/2024  Lipid panel    Standing Status:   Future    Expected Date:   07/11/2023    Expiration Date:   07/08/2024    Has the patient fasted?:   Yes   Hemoglobin A1c    Standing Status:   Future    Expected Date:   07/11/2023    Expiration Date:   07/08/2024   COMPLETE METABOLIC PANEL WITH GFR    Standing Status:   Future    Expected Date:   07/11/2023    Expiration Date:   07/08/2024   CBC with Differential/Platelet    Standing Status:   Future    Expected Date:   07/11/2023    Expiration Date:   07/08/2024   Vitamin B12    Standing Status:   Future    Expected Date:   07/11/2023    Expiration  Date:   07/08/2024   VITAMIN D 25 Hydroxy (Vit-D Deficiency, Fractures)    Standing Status:   Future    Expected Date:   07/11/2023    Expiration Date:   07/08/2024    No orders of the defined types were placed in this encounter.    Follow up plan: Return in about 6 months (around 01/05/2024) for 6 month Mychart virtual video Anxiety, hot flashes, updates.  Lab orders for 07/11/23  Saralyn Pilar, DO [facility] 07/08/2023, 4:12 PM

## 2023-07-11 ENCOUNTER — Other Ambulatory Visit: Payer: Federal, State, Local not specified - PPO

## 2023-07-11 DIAGNOSIS — E538 Deficiency of other specified B group vitamins: Secondary | ICD-10-CM

## 2023-07-11 DIAGNOSIS — R7309 Other abnormal glucose: Secondary | ICD-10-CM

## 2023-07-11 DIAGNOSIS — E559 Vitamin D deficiency, unspecified: Secondary | ICD-10-CM

## 2023-07-11 DIAGNOSIS — F410 Panic disorder [episodic paroxysmal anxiety] without agoraphobia: Secondary | ICD-10-CM

## 2023-07-11 DIAGNOSIS — R232 Flushing: Secondary | ICD-10-CM

## 2023-07-11 DIAGNOSIS — E66811 Obesity, class 1: Secondary | ICD-10-CM

## 2023-07-11 DIAGNOSIS — F5101 Primary insomnia: Secondary | ICD-10-CM

## 2023-07-11 DIAGNOSIS — Z0000 Encounter for general adult medical examination without abnormal findings: Secondary | ICD-10-CM

## 2023-07-17 ENCOUNTER — Ambulatory Visit: Payer: Federal, State, Local not specified - PPO | Admitting: Internal Medicine

## 2023-07-17 NOTE — Progress Notes (Deleted)
 Subjective:    Patient ID: Sharon Ponce, female    DOB: 03/01/1971, 53 y.o.   MRN: [id number]  HPI  Patient presents to clinic today for her Pap smear.  Her last Pap was 03/2018.  Review of Systems   Past Medical History:  Diagnosis Date   Allergy    Anxiety    Migraine     Current Outpatient Medications  Medication Sig Dispense Refill   acidophilus (RISAQUAD) CAPS capsule Take 1 capsule by mouth daily.     B Complex-C (SUPER B COMPLEX PO) Take 1 tablet by mouth daily.     CRANBERRY PO Take 1 tablet by mouth daily.     ELDERBERRY PO Take 1 capsule by mouth daily.     melatonin 5 MG TABS Take 5 mg by mouth at bedtime.     Multiple Vitamin (MULTIVITAMIN) tablet Take 1 tablet by mouth daily.     Nutritional Supplements (DHEA PO) Take 1 capsule by mouth daily.     Omega-3 Fatty Acids (OMEGA-3 FISH OIL PO) Take 1 capsule by mouth daily.     rizatriptan (MAXALT-MLT) 10 MG disintegrating tablet Take 1 tablet (10 mg total) by mouth as needed for migraine. May repeat in 2 hours if needed 9 tablet 11   temazepam (RESTORIL) 15 MG capsule TAKE 1 TO 2 CAPSULES(15 TO 30 MG) BY MOUTH AT BEDTIME AS NEEDED FOR SLEEP 60 capsule 5   venlafaxine XR (EFFEXOR-XR) 150 MG 24 hr capsule Take 1 capsule (150 mg total) by mouth at bedtime. 90 capsule 3   No current facility-administered medications for this visit.    Allergies  Allergen Reactions   Codeine Nausea And Vomiting   Dust Mite Extract    Mold Extract [Trichophyton]    Tree Extract     Family History  Problem Relation Age of Onset   Healthy Mother    Healthy Father    Migraines Father    Leukemia Maternal Grandmother    Heart disease Maternal Grandfather    Vision loss Paternal Aunt    Migraines Maternal Aunt    Breast cancer Neg Hx    Colon cancer Neg Hx    Stroke Neg Hx    Ovarian cancer Neg Hx     Social History   Socioeconomic History   Marital status: Single    Spouse name: Not on file   Number of children: Not on  file   Years of education: Not on file   Highest education level: Master's degree (e.g., MA, MS, MEng, MEd, MSW, MBA)  Occupational History   Occupation: Agricultural engineer: [employer]  Tobacco Use   Smoking status: Some Days    Current packs/day: 0.25    Average packs/day: 0.3 packs/day for 20.0 years (5.0 ttl pk-yrs)    Types: Cigarettes   Smokeless tobacco: Never   Tobacco comments:    some days  Vaping Use   Vaping status: Never Used  Substance and Sexual Activity   Alcohol use: Never   Drug use: Never   Sexual activity: Not Currently    Birth control/protection: None  Other Topics Concern   Not on file  Social History Narrative   Lives alone    Right handed   Caffeine: approx 6 cups/day   Social Drivers of Corporate investment banker Strain: Low Risk  (07/08/2023)   Overall Financial Resource Strain (CARDIA)    Difficulty of Paying Living Expenses: Not hard at all  Food Insecurity: No Food  Insecurity (07/08/2023)   Hunger Vital Sign    Worried About Running Out of Food in the Last Year: Never true    Ran Out of Food in the Last Year: Never true  Transportation Needs: No Transportation Needs (07/08/2023)   PRAPARE - Administrator, Civil Service (Medical): No    Lack of Transportation (Non-Medical): No  Physical Activity: Insufficiently Active (07/08/2023)   Exercise Vital Sign    Days of Exercise per Week: 2 days    Minutes of Exercise per Session: 20 min  Stress: Stress Concern Present (07/08/2023)   [employer] of Occupational Health - Occupational Stress Questionnaire    Feeling of Stress : To some extent  Social Connections: Socially Isolated (07/08/2023)   Social Connection and Isolation Panel [NHANES]    Frequency of Communication with Friends and Family: More than three times a week    Frequency of Social Gatherings with Friends and Family: Once a week    Attends Religious Services: Never    Database administrator or Organizations: No     Attends Engineer, structural: Not on file    Marital Status: Divorced  Intimate Partner Violence: Not At Risk (05/04/2018)   Humiliation, Afraid, Rape, and Kick questionnaire    Fear of Current or Ex-Partner: No    Emotionally Abused: No    Physically Abused: No    Sexually Abused: No     Constitutional: Denies fever, malaise, fatigue, headache or abrupt weight changes.  HEENT: Denies eye pain, eye redness, ear pain, ringing in the ears, wax buildup, runny nose, nasal congestion, bloody nose, or sore throat. Respiratory: Denies difficulty breathing, shortness of breath, cough or sputum production.   Cardiovascular: Denies chest pain, chest tightness, palpitations or swelling in the hands or feet.  Gastrointestinal: Denies abdominal pain, bloating, constipation, diarrhea or blood in the stool.  GU: Denies urgency, frequency, pain with urination, burning sensation, blood in urine, odor or discharge. Musculoskeletal: Denies decrease in range of motion, difficulty with gait, muscle pain or joint pain and swelling.  Skin: Denies redness, rashes, lesions or ulcercations.  Neurological: Denies dizziness, difficulty with memory, difficulty with speech or problems with balance and coordination.  Psych: Denies anxiety, depression, SI/HI.  No other specific complaints in a complete review of systems (except as listed in HPI above).      Objective:   Physical Exam  There were no vitals taken for this visit. Wt Readings from Last 3 Encounters:  07/08/23 125 lb (56.7 kg)  06/21/22 152 lb 9.6 oz (69.2 kg)  02/06/22 163 lb 9.6 oz (74.2 kg)    General: Appears her stated age, well developed, well nourished in NAD. Cardiovascular: Normal rate. Pulmonary/Chest: Normal effort. Abdomen: Soft and nontender. Normal bowel sounds. No distention or masses noted.  Pelvic: Normal female anatomy.  Cervix without mass or lesion.  No CMT.  Adnexa nonpalpable. Neurological: Alert and oriented.    BMET    Component Value Date/Time   NA 137 06/21/2022 1427   NA 135 11/14/2020 0818   K 4.7 06/21/2022 1427   CL 104 06/21/2022 1427   CO2 26 06/21/2022 1427   GLUCOSE 81 06/21/2022 1427   BUN 14 06/21/2022 1427   BUN 16 11/14/2020 0818   CREATININE 0.77 06/21/2022 1427   CALCIUM 9.5 06/21/2022 1427    Lipid Panel     Component Value Date/Time   CHOL 244 (H) 06/21/2022 1427   TRIG 64 06/21/2022 1427   HDL 67  06/21/2022 1427   CHOLHDL 3.6 06/21/2022 1427   LDLCALC 161 (H) 06/21/2022 1427    CBC    Component Value Date/Time   WBC 9.1 06/21/2022 1427   RBC 4.32 06/21/2022 1427   HGB 13.8 06/21/2022 1427   HGB 13.9 11/14/2020 0818   HCT 39.7 06/21/2022 1427   HCT 41.4 11/14/2020 0818   PLT 287 06/21/2022 1427   PLT 291 11/14/2020 0818   MCV 91.9 06/21/2022 1427   MCV 95 11/14/2020 0818   MCH 31.9 06/21/2022 1427   MCHC 34.8 06/21/2022 1427   RDW 12.1 06/21/2022 1427   RDW 12.4 11/14/2020 0818   LYMPHSABS 3,531 06/21/2022 1427   LYMPHSABS 3.2 (H) 11/14/2020 0818   EOSABS 482 06/21/2022 1427   EOSABS 0.4 11/14/2020 0818   BASOSABS 82 06/21/2022 1427   BASOSABS 0.1 11/14/2020 0818    Hgb A1C Lab Results  Component Value Date   HGBA1C 5.0 06/21/2022            Assessment & Plan:   Screen for cervical cancer with routine GYN exam:  Pap smear today, she declines STD screening  Follow-up with your PCP as previously scheduled Nicki Reaper, NP

## 2023-07-25 ENCOUNTER — Encounter: Payer: Self-pay | Admitting: Family Medicine

## 2023-07-25 ENCOUNTER — Encounter: Payer: Self-pay | Admitting: Internal Medicine

## 2023-07-25 ENCOUNTER — Ambulatory Visit: Payer: Federal, State, Local not specified - PPO | Admitting: Internal Medicine

## 2023-07-25 ENCOUNTER — Other Ambulatory Visit (HOSPITAL_COMMUNITY)
Admission: RE | Admit: 2023-07-25 | Discharge: 2023-07-25 | Disposition: A | Discharge status: Home / Self Care | Source: Ambulatory Visit | Attending: Internal Medicine | Admitting: Internal Medicine

## 2023-07-25 VITALS — BP 108/68 | Ht 63.5 in | Wt 125.6 lb

## 2023-07-25 DIAGNOSIS — R7309 Other abnormal glucose: Secondary | ICD-10-CM | POA: Diagnosis not present

## 2023-07-25 DIAGNOSIS — E559 Vitamin D deficiency, unspecified: Secondary | ICD-10-CM | POA: Diagnosis not present

## 2023-07-25 DIAGNOSIS — Z01419 Encounter for gynecological examination (general) (routine) without abnormal findings: Secondary | ICD-10-CM

## 2023-07-25 DIAGNOSIS — Z124 Encounter for screening for malignant neoplasm of cervix: Secondary | ICD-10-CM | POA: Diagnosis not present

## 2023-07-25 DIAGNOSIS — E7800 Pure hypercholesterolemia, unspecified: Secondary | ICD-10-CM | POA: Insufficient documentation

## 2023-07-25 DIAGNOSIS — E538 Deficiency of other specified B group vitamins: Secondary | ICD-10-CM | POA: Diagnosis not present

## 2023-07-25 NOTE — Patient Instructions (Signed)
Pap Test Why am I having this test? A Pap test, also called a Pap smear, is a screening test to check for signs of: Infection. Cancer of the cervix. The cervix is the lower part of the uterus that opens into the vagina. Changes that may be a sign that cancer is developing (precancerous changes). Women need this test on a regular basis. In general, you should have a Pap test every 3 years until you reach menopause or age 55. Women aged 30-60 may choose to have their Pap test done at the same time as an HPV (human papillomavirus) test every 5 years (instead of every 3 years). Your health care provider may recommend having Pap tests more or less often depending on your medical conditions and past Pap test results. What is being tested? Cervical cells are tested for signs of infection or abnormalities. What kind of sample is taken?  Your health care provider will collect a sample of cells from the surface of your cervix. This will be done using a small cotton swab, plastic spatula, or brush that is inserted into your vagina using a tool called a speculum. This sample is often collected during a pelvic exam, when you are lying on your back on an exam table with your feet in footrests (stirrups). In some cases, fluids (secretions) from the cervix or vagina may also be collected. How do I prepare for this test? Be aware of where you are in your menstrual cycle. If you are menstruating on the day of the test, you may be asked to reschedule. You may need to reschedule if you have a known vaginal infection on the day of the test. Follow instructions from your health care provider about: Changing or stopping your regular medicines. Some medicines can cause abnormal test results, such as vaginal medicines and tetracycline. Avoiding douching 2-3 days before or the day of the test. Tell a health care provider about: Any allergies you have. All medicines you are taking, including vitamins, herbs, eye drops,  creams, and over-the-counter medicines. Any bleeding problems you have. Any surgeries you have had. Any medical conditions you have. Whether you are pregnant or may be pregnant. How are the results reported? Your test results will be reported as either abnormal or normal. What do the results mean? A normal test result means that you do not have signs of cancer of the cervix. An abnormal result may mean that you have: Cancer. A Pap test by itself is not enough to diagnose cancer. You will have more tests done if cancer is suspected. Precancerous changes in your cervix. Inflammation of the cervix. An STI (sexually transmitted infection). A fungal infection. A parasite infection. Talk with your health care provider about what your results mean. In some cases, your health care provider may do more testing to confirm the results. Questions to ask your health care provider Ask your health care provider, or the department that is doing the test: When will my results be ready? How will I get my results? What are my treatment options? What other tests do I need? What are my next steps? Summary In general, women should have a Pap test every 3 years until they reach menopause or age 55. Your health care provider will collect a sample of cells from the surface of your cervix. This will be done using a small cotton swab, plastic spatula, or brush. In some cases, fluids (secretions) from the cervix or vagina may also be collected. This information is not   intended to replace advice given to you by your health care provider. Make sure you discuss any questions you have with your health care provider. Document Revised: 08/04/2020 Document Reviewed: 08/04/2020 Elsevier Patient Education  2024 Elsevier Inc.  

## 2023-07-25 NOTE — Progress Notes (Signed)
 Subjective:    Patient ID: Sharon Ponce, female    DOB: 08-Feb-1971, 53 y.o.   MRN: [id number]  HPI  Patient presents to clinic today for her Pap smear.  Her last Pap was 03/2018.  She is postmenopausal.  She is not sexually active.  Review of Systems   Past Medical History:  Diagnosis Date   Allergy    Anxiety    Migraine     Current Outpatient Medications  Medication Sig Dispense Refill   acidophilus (RISAQUAD) CAPS capsule Take 1 capsule by mouth daily.     B Complex-C (SUPER B COMPLEX PO) Take 1 tablet by mouth daily.     CRANBERRY PO Take 1 tablet by mouth daily.     ELDERBERRY PO Take 1 capsule by mouth daily.     melatonin 5 MG TABS Take 5 mg by mouth at bedtime.     Multiple Vitamin (MULTIVITAMIN) tablet Take 1 tablet by mouth daily.     Nutritional Supplements (DHEA PO) Take 1 capsule by mouth daily.     Omega-3 Fatty Acids (OMEGA-3 FISH OIL PO) Take 1 capsule by mouth daily.     rizatriptan (MAXALT-MLT) 10 MG disintegrating tablet Take 1 tablet (10 mg total) by mouth as needed for migraine. May repeat in 2 hours if needed 9 tablet 11   temazepam (RESTORIL) 15 MG capsule TAKE 1 TO 2 CAPSULES(15 TO 30 MG) BY MOUTH AT BEDTIME AS NEEDED FOR SLEEP 60 capsule 5   venlafaxine XR (EFFEXOR-XR) 150 MG 24 hr capsule Take 1 capsule (150 mg total) by mouth at bedtime. 90 capsule 3   No current facility-administered medications for this visit.    Allergies  Allergen Reactions   Codeine Nausea And Vomiting   Dust Mite Extract    Mold Extract [Trichophyton]    Tree Extract     Family History  Problem Relation Age of Onset   Healthy Mother    Healthy Father    Migraines Father    Leukemia Maternal Grandmother    Heart disease Maternal Grandfather    Vision loss Paternal Aunt    Migraines Maternal Aunt    Breast cancer Neg Hx    Colon cancer Neg Hx    Stroke Neg Hx    Ovarian cancer Neg Hx     Social History   Socioeconomic History   Marital status: Single     Spouse name: Not on file   Number of children: Not on file   Years of education: Not on file   Highest education level: Master's degree (e.g., MA, MS, MEng, MEd, MSW, MBA)  Occupational History   Occupation: Agricultural engineer: [employer]  Tobacco Use   Smoking status: Some Days    Current packs/day: 0.25    Average packs/day: 0.3 packs/day for 20.0 years (5.0 ttl pk-yrs)    Types: Cigarettes   Smokeless tobacco: Never   Tobacco comments:    some days  Vaping Use   Vaping status: Never Used  Substance and Sexual Activity   Alcohol use: Never   Drug use: Never   Sexual activity: Not Currently    Birth control/protection: None  Other Topics Concern   Not on file  Social History Narrative   Lives alone    Right handed   Caffeine: approx 6 cups/day   Social Drivers of Health   Financial Resource Strain: Low Risk  (07/08/2023)   Overall Financial Resource Strain (CARDIA)    Difficulty of Paying Living  Expenses: Not hard at all  Food Insecurity: No Food Insecurity (07/08/2023)   Hunger Vital Sign    Worried About Running Out of Food in the Last Year: Never true    Ran Out of Food in the Last Year: Never true  Transportation Needs: No Transportation Needs (07/08/2023)   PRAPARE - Administrator, Civil Service (Medical): No    Lack of Transportation (Non-Medical): No  Physical Activity: Insufficiently Active (07/08/2023)   Exercise Vital Sign    Days of Exercise per Week: 2 days    Minutes of Exercise per Session: 20 min  Stress: Stress Concern Present (07/08/2023)   [employer] of Occupational Health - Occupational Stress Questionnaire    Feeling of Stress : To some extent  Social Connections: Socially Isolated (07/08/2023)   Social Connection and Isolation Panel [NHANES]    Frequency of Communication with Friends and Family: More than three times a week    Frequency of Social Gatherings with Friends and Family: Once a week    Attends Religious Services: Never     Database administrator or Organizations: No    Attends Engineer, structural: Not on file    Marital Status: Divorced  Intimate Partner Violence: Not At Risk (05/04/2018)   Humiliation, Afraid, Rape, and Kick questionnaire    Fear of Current or Ex-Partner: No    Emotionally Abused: No    Physically Abused: No    Sexually Abused: No     Constitutional: Denies fever, malaise, fatigue, headache or abrupt weight changes.  Respiratory: Denies difficulty breathing, shortness of breath, cough or sputum production.   Cardiovascular: Denies chest pain, chest tightness, palpitations or swelling in the hands or feet.  Gastrointestinal: Denies abdominal pain, bloating, constipation, diarrhea or blood in the stool.  GU: Denies urgency, frequency, pain with urination, burning sensation, blood in urine, odor or discharge.  No other specific complaints in a complete review of systems (except as listed in HPI above).      Objective:   Physical Exam  BP 108/68 (BP Location: Left Arm, Patient Position: Sitting, Cuff Size: Normal)   Ht 5' 3.5" (1.613 m)   Wt 125 lb 9.6 oz (57 kg)   BMI 21.90 kg/m   Wt Readings from Last 3 Encounters:  07/08/23 125 lb (56.7 kg)  06/21/22 152 lb 9.6 oz (69.2 kg)  02/06/22 163 lb 9.6 oz (74.2 kg)    General: Appears her stated age, well developed, well nourished in NAD. Cardiovascular: Normal rate. Pulmonary/Chest: Normal effort. Abdomen: Soft and nontender.  Pelvic: Normal female anatomy.  Cervix without mass or lesion.  No CMT.  Adnexa nonpalpable. Neurological: Alert and oriented.   BMET    Component Value Date/Time   NA 137 06/21/2022 1427   NA 135 11/14/2020 0818   K 4.7 06/21/2022 1427   CL 104 06/21/2022 1427   CO2 26 06/21/2022 1427   GLUCOSE 81 06/21/2022 1427   BUN 14 06/21/2022 1427   BUN 16 11/14/2020 0818   CREATININE 0.77 06/21/2022 1427   CALCIUM 9.5 06/21/2022 1427    Lipid Panel     Component Value Date/Time   CHOL 244  (H) 06/21/2022 1427   TRIG 64 06/21/2022 1427   HDL 67 06/21/2022 1427   CHOLHDL 3.6 06/21/2022 1427   LDLCALC 161 (H) 06/21/2022 1427    CBC    Component Value Date/Time   WBC 9.1 06/21/2022 1427   RBC 4.32 06/21/2022 1427   HGB 13.8  06/21/2022 1427   HGB 13.9 11/14/2020 0818   HCT 39.7 06/21/2022 1427   HCT 41.4 11/14/2020 0818   PLT 287 06/21/2022 1427   PLT 291 11/14/2020 0818   MCV 91.9 06/21/2022 1427   MCV 95 11/14/2020 0818   MCH 31.9 06/21/2022 1427   MCHC 34.8 06/21/2022 1427   RDW 12.1 06/21/2022 1427   RDW 12.4 11/14/2020 0818   LYMPHSABS 3,531 06/21/2022 1427   LYMPHSABS 3.2 (H) 11/14/2020 0818   EOSABS 482 06/21/2022 1427   EOSABS 0.4 11/14/2020 0818   BASOSABS 82 06/21/2022 1427   BASOSABS 0.1 11/14/2020 0818    Hgb A1C Lab Results  Component Value Date   HGBA1C 5.0 06/21/2022            Assessment & Plan:   Screen for cervical cancer with routine GYN exam:  Pap smear today, she declines STD screening  Follow-up with your PCP as previously scheduled Nicki Reaper, NP

## 2023-07-26 ENCOUNTER — Encounter: Payer: Self-pay | Admitting: Family Medicine

## 2023-07-26 LAB — CBC WITH DIFFERENTIAL/PLATELET
Absolute Lymphocytes: 3149 {cells}/uL (ref 850–3900)
Absolute Monocytes: 501 {cells}/uL (ref 200–950)
Basophils Absolute: 82 {cells}/uL (ref 0–200)
Basophils Relative: 0.9 %
Eosinophils Absolute: 510 {cells}/uL — ABNORMAL HIGH (ref 15–500)
Eosinophils Relative: 5.6 %
HCT: 42.5 % (ref 35.0–45.0)
Hemoglobin: 14.5 g/dL (ref 11.7–15.5)
MCH: 31.9 pg (ref 27.0–33.0)
MCHC: 34.1 g/dL (ref 32.0–36.0)
MCV: 93.4 fL (ref 80.0–100.0)
MPV: 10.5 fL (ref 7.5–12.5)
Monocytes Relative: 5.5 %
Neutro Abs: 4859 {cells}/uL (ref 1500–7800)
Neutrophils Relative %: 53.4 %
Platelets: 285 10*3/uL (ref 140–400)
RBC: 4.55 10*6/uL (ref 3.80–5.10)
RDW: 12.1 % (ref 11.0–15.0)
Total Lymphocyte: 34.6 %
WBC: 9.1 10*3/uL (ref 3.8–10.8)

## 2023-07-26 LAB — LIPID PANEL
Cholesterol: 241 mg/dL — ABNORMAL HIGH (ref <200)
HDL: 84 mg/dL (ref >=50)
LDL Cholesterol (Calc): 143 mg/dL — ABNORMAL HIGH
Non-HDL Cholesterol (Calc): 157 mg/dL — ABNORMAL HIGH (ref <130)
Total CHOL/HDL Ratio: 2.9 (calc) (ref <5.0)
Triglycerides: 57 mg/dL (ref <150)

## 2023-07-26 LAB — HEMOGLOBIN A1C
Hgb A1c MFr Bld: 5.2 %{Hb} (ref <5.7)
Mean Plasma Glucose: 103 mg/dL
eAG (mmol/L): 5.7 mmol/L

## 2023-07-26 LAB — COMPLETE METABOLIC PANEL WITH GFR
AG Ratio: 2 (calc) (ref 1.0–2.5)
ALT: 14 U/L (ref 6–29)
AST: 18 U/L (ref 10–35)
Albumin: 4.5 g/dL (ref 3.6–5.1)
Alkaline phosphatase (APISO): 54 U/L (ref 37–153)
BUN: 23 mg/dL (ref 7–25)
CO2: 26 mmol/L (ref 20–32)
Calcium: 9.6 mg/dL (ref 8.6–10.4)
Chloride: 104 mmol/L (ref 98–110)
Creat: 0.78 mg/dL (ref 0.50–1.03)
Globulin: 2.3 g/dL (ref 1.9–3.7)
Glucose, Bld: 82 mg/dL (ref 65–99)
Potassium: 4.6 mmol/L (ref 3.5–5.3)
Sodium: 137 mmol/L (ref 135–146)
Total Bilirubin: 0.3 mg/dL (ref 0.2–1.2)
Total Protein: 6.8 g/dL (ref 6.1–8.1)
eGFR: 90 mL/min/{1.73_m2} (ref >=60)

## 2023-07-26 LAB — TSH: TSH: 1.35 m[IU]/L

## 2023-07-26 LAB — VITAMIN D 25 HYDROXY (VIT D DEFICIENCY, FRACTURES): Vit D, 25-Hydroxy: 41 ng/mL (ref 30–100)

## 2023-07-26 LAB — VITAMIN B12: Vitamin B-12: 779 pg/mL (ref 200–1100)

## 2023-07-30 ENCOUNTER — Encounter: Payer: Self-pay | Admitting: Internal Medicine

## 2023-07-30 LAB — CYTOLOGY - PAP
Comment: NEGATIVE
Diagnosis: NEGATIVE
High risk HPV: NEGATIVE

## 2023-07-31 ENCOUNTER — Encounter: Payer: Self-pay | Admitting: Family Medicine

## 2023-07-31 DIAGNOSIS — E8941 Symptomatic postprocedural ovarian failure: Secondary | ICD-10-CM

## 2023-07-31 DIAGNOSIS — G43709 Chronic migraine without aura, not intractable, without status migrainosus: Secondary | ICD-10-CM

## 2023-08-05 ENCOUNTER — Other Ambulatory Visit: Payer: Self-pay

## 2023-08-05 MED ORDER — RIZATRIPTAN BENZOATE 10 MG PO TBDP: 10.0000 mg | ORAL_TABLET | ORAL | 11 refills | Status: AC | PRN

## 2023-08-07 MED ORDER — GABAPENTIN 100 MG PO CAPS
100.0000 mg | ORAL_CAPSULE | Freq: Three times a day (TID) | ORAL | 1 refills | Status: DC
Start: 2023-08-07 — End: 2024-01-06

## 2023-08-07 NOTE — Addendum Note (Signed)
 Addended by: Smitty Cords on: 08/07/2023 01:25 PM   Modules accepted: Orders

## 2023-10-07 ENCOUNTER — Encounter (INDEPENDENT_AMBULATORY_CARE_PROVIDER_SITE_OTHER): Payer: Self-pay

## 2023-11-27 ENCOUNTER — Other Ambulatory Visit: Payer: Self-pay | Admitting: Family Medicine

## 2023-11-27 DIAGNOSIS — F5101 Primary insomnia: Secondary | ICD-10-CM

## 2023-11-27 NOTE — Telephone Encounter (Signed)
 Copied from CRM [id number]. Topic: Clinical - Medication Refill >> Nov 27, 2023  4:05 PM Tiffini S wrote: Medication:  temazepam  (RESTORIL ) 15 MG capsule  Has the patient contacted their pharmacy? Yes (Agent: If no, request that the patient contact the pharmacy for the refill. If patient does not wish to contact the pharmacy document the reason why and proceed with request.) (Agent: If yes, when and what did the pharmacy advise?)  This is the patient's preferred pharmacy:  [facility] [address] ([address]),  - [address] [place]) [place] [address] Phone: [phone] Fax: [phone]  Is this the correct pharmacy for this prescription? Yes If no, delete pharmacy and type the correct one.   Has the prescription been filled recently? Yes  Is the patient out of the medication? Yes  Has the patient been seen for an appointment in the last year OR does the patient have an upcoming appointment? Yes  Can we respond through MyChart? Yes  Agent: Please be advised that Rx refills may take up to 3 business days. We ask that you follow-up with your pharmacy.

## 2023-11-28 NOTE — Telephone Encounter (Signed)
 Requested medication (s) are due for refill today: yes  Requested medication (s) are on the active medication list: yes  Last refill:  06/12/23 #60 1 RF  Future visit scheduled: yes  Notes to clinic:  med not delegated to NT to RF   Requested Prescriptions  Pending Prescriptions Disp Refills   temazepam  (RESTORIL ) 15 MG capsule [Pharmacy Med Name: Temazepam  15 MG Oral Capsule] 60 capsule 0    Sig: TAKE 1 TO 2 CAPSULES (15 TO 30 MG) BY MOUTH AT BEDTIME AS NEEDED FOR SLEEP     Not Delegated - Psychiatry: Anxiolytics/Hypnotics 2 Failed - 11/28/2023  3:57 PM      Failed - This refill cannot be delegated      Failed - Urine Drug Screen completed in last 360 days      Passed - Patient is not pregnant      Passed - Valid encounter within last 6 months    Recent Outpatient Visits           4 months ago Encounter for gynecological examination without abnormal finding   [facility] [place], Angeline ORN, NP   4 months ago Annual physical exam   [facility] [place] Marsa PARAS, DO       Future Appointments             In 1 month Edman, Marsa PARAS, DO [place] [facility], [facility]

## 2023-11-29 NOTE — Telephone Encounter (Signed)
 Requested medication (s) are due for refill today: no  Requested medication (s) are on the active medication list: yes  Last refill:  11/28/23 #60 5 RF  Future visit scheduled: yes  Notes to clinic:  med not delegated to NT to RF   Requested Prescriptions  Pending Prescriptions Disp Refills   temazepam  (RESTORIL ) 15 MG capsule 60 capsule 5    Sig: TAKE 1 TO 2 CAPSULES(15 TO 30 MG) BY MOUTH AT BEDTIME AS NEEDED FOR SLEEP     Not Delegated - Psychiatry: Anxiolytics/Hypnotics 2 Failed - 11/29/2023 11:06 AM      Failed - This refill cannot be delegated      Failed - Urine Drug Screen completed in last 360 days      Passed - Patient is not pregnant      Passed - Valid encounter within last 6 months    Recent Outpatient Visits           4 months ago Encounter for gynecological examination without abnormal finding   [facility] [place], Sharon ORN, Sharon Ponce   4 months ago Annual physical exam   [facility] [place] Sharon PARAS, Sharon Ponce       Future Appointments             In 1 month Edman, Sharon PARAS, Sharon Ponce [facility], [facility]

## 2024-01-06 ENCOUNTER — Telehealth (INDEPENDENT_AMBULATORY_CARE_PROVIDER_SITE_OTHER): Payer: Federal, State, Local not specified - PPO | Admitting: Family Medicine

## 2024-01-06 ENCOUNTER — Encounter: Payer: Self-pay | Admitting: Family Medicine

## 2024-01-06 DIAGNOSIS — F410 Panic disorder [episodic paroxysmal anxiety] without agoraphobia: Secondary | ICD-10-CM | POA: Diagnosis not present

## 2024-01-06 DIAGNOSIS — F411 Generalized anxiety disorder: Secondary | ICD-10-CM | POA: Diagnosis not present

## 2024-01-06 DIAGNOSIS — E8941 Symptomatic postprocedural ovarian failure: Secondary | ICD-10-CM | POA: Diagnosis not present

## 2024-01-06 DIAGNOSIS — F5101 Primary insomnia: Secondary | ICD-10-CM | POA: Diagnosis not present

## 2024-01-06 MED ORDER — VENLAFAXINE HCL ER 75 MG PO CP24: 75.0000 mg | ORAL_CAPSULE | Freq: Every day | ORAL | 1 refills | Status: AC

## 2024-01-06 MED ORDER — GABAPENTIN 100 MG PO CAPS: 100.0000 mg | ORAL_CAPSULE | Freq: Three times a day (TID) | ORAL | 1 refills | Status: AC

## 2024-01-06 NOTE — Progress Notes (Signed)
 Subjective:    Patient ID: Sharon Ponce, female    DOB: 21-Nov-1970, 53 y.o.   MRN: [id number]  Sharon Ponce is a 53 y.o. female presenting on 01/06/2024 for Anxiety   Virtual / Telehealth Encounter - Video Visit via MyChart The purpose of this virtual visit is to provide medical care while limiting exposure to the novel coronavirus (COVID19) for both patient and office staff.  Consent was obtained for remote visit:  Yes.   Answered questions that patient had about telehealth interaction:  Yes.   I discussed the limitations, risks, security and privacy concerns of performing an evaluation and management service by video/telephone. I also discussed with the patient that there may be a patient responsible charge related to this service. The patient expressed understanding and agreed to proceed.  Patient Location: Home Provider Location: [facility] (Office)  Participants in virtual visit: - Patient: Sharon Ponce - CMA: Alan Fontana CMA - Provider: Dr Edman   HPI  Discussed the use of AI scribe software for clinical note transcription with the patient, who gave verbal consent to proceed.  History of Present Illness   Sharon Ponce is a 53 year old female who presents for a six-month follow-up.  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 No panic attacks lately  Insomnia - Chronic difficulty with sleep due to a constantly active mind - Managed with temazepam , last ordered in July with sufficient refills - Able to manage sleep well with current regimen on Temazepam .  Vasomotor symptoms (hot flashes) - Experiences hot flashes, primarily at night - Effectively managed with gabapentin , taken at night during the  week to avoid daytime grogginess - Adjusts gabapentin  timing on weekends if planning to nap  Episodic Migraine headaches - Decrease in migraine frequency since June, attributed to reduced stress - Migraines still occur occasionally, particularly with weather changes - Current migraines are less severe than previous episodes  - taking Rizatriptan  ODT AS NEEDED, has refills          01/06/2024   12:50 PM 07/25/2023    9:18 AM 07/08/2023    3:47 PM  Depression screen PHQ 2/9  Decreased Interest 0 2 0  Down, Depressed, Hopeless 0 1 0  PHQ - 2 Score 0 3 0  Altered sleeping 2 2 1   Tired, decreased energy 1 1 1   Change in appetite 0 0 0  Feeling bad or failure about yourself  0 0 0  Trouble concentrating 0 1 1  Moving slowly or fidgety/restless 0 0 0  Suicidal thoughts 0 0 0  PHQ-9 Score 3 7 3   Difficult doing work/chores Not difficult at all Somewhat difficult Somewhat difficult       01/06/2024   12:51 PM 07/25/2023    9:19 AM 07/08/2023    3:48 PM 03/27/2023   11:51 AM  GAD 7 : Generalized Anxiety Score  Nervous, Anxious, on Edge 1 2 3 2   Control/stop worrying 1 2 3 2   Worry too much - different things 1 1 1 3   Trouble relaxing 0 2 3 1   Restless 0 1 3 1   Easily annoyed or irritable 1 2 3 3   Afraid - awful might happen 0 2 3 2   Total GAD 7 Score 4  12 19 14   Anxiety Difficulty Not difficult at all Somewhat difficult Extremely difficult Very difficult    Social History   Tobacco Use   Smoking status: Some Days    Current packs/day: 0.25    Average packs/day: 0.3 packs/day for 20.0 years (5.0 ttl pk-yrs)    Types: Cigarettes   Smokeless tobacco: Never   Tobacco comments:    some days  Vaping Use   Vaping status: Never Used  Substance Use Topics   Alcohol use: Never   Drug use: Never    Review of Systems Per HPI unless specifically indicated above     Objective:    There were no vitals taken for this visit.  Wt Readings from Last 3 Encounters:  07/25/23 125 lb  9.6 oz (57 kg)  07/08/23 125 lb (56.7 kg)  06/21/22 152 lb 9.6 oz (69.2 kg)     Physical Exam  Note examination was completely remotely via video observation objective data only  Gen - well-appearing, no acute distress or apparent pain, comfortable HEENT - eyes appear clear without discharge or redness Heart/Lungs - cannot examine virtually - observed no evidence of coughing or labored breathing. Abd - cannot examine virtually  Skin - face visible today- no rash Neuro - awake, alert, oriented Psych - not anxious appearing   Results for orders placed or performed in visit on 07/25/23  Cytology - PAP   Collection Time: 07/25/23  9:23 AM  Result Value Ref Range   High risk HPV Negative    Adequacy      Satisfactory for evaluation; transformation zone component PRESENT.   Diagnosis      - Negative for intraepithelial lesion or malignancy (NILM)   Microorganisms Shift in flora suggestive of bacterial vaginosis    Comment Normal Reference Range HPV - Negative       Assessment & Plan:   Problem List Items Addressed This Visit     Generalized anxiety disorder with panic attacks - Primary   Relevant Medications   venlafaxine  XR (EFFEXOR -XR) 75 MG 24 hr capsule   Hot flashes due to surgical menopause   Relevant Medications   gabapentin  (NEURONTIN ) 100 MG capsule   Primary insomnia      Generalized anxiety disorder Chronic problem, improved with recent stress reduction with change of job. 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  Improved anxiety now will reduce meds - Reduce venlafaxine  dose from 150 mg to 75 mg extended release. Note it also helps as migraine prevention, we can keep low to moderate dose - Order 90-day supply with refills. - Monitor response to dose reduction and adjust as needed.  Episodic Migraines Decreased frequency due to reduced stress, occasional weather-related occurrence. Continue  Rizatriptan  ODT AS NEEDED   Insomnia Chronic primary underlying problem managed with temazepam  Has refills available.  Vasomotor symptoms of menopause (hot flashes) Well-controlled with gabapentin , dosing adjusted for weekdays takes 100mg  x 3 = 300mg  at night and weekends if need can take 1-2 in day and rest at night - Renew gabapentin  prescription with additional refills.         No orders of the defined types were placed in this encounter.   Meds ordered this encounter  Medications   venlafaxine  XR (EFFEXOR -XR) 75 MG 24 hr capsule    Sig: Take 1 capsule (75 mg total) by mouth daily with breakfast.    Dispense:  90 capsule    Refill:  1  Dose reduction from 150 to 75   gabapentin  (NEURONTIN ) 100 MG capsule    Sig: Take 1 capsule (100 mg total) by mouth 3 (three) times daily.    Dispense:  270 capsule    Refill:  1    Add refills    Follow up plan: Return for 7 month fasting lab > 1 week later Annual Physical.   Patient verbalizes understanding with the above medical recommendations including the limitation of remote medical advice.  Specific follow-up and call-back criteria were given for patient to follow-up or seek medical care more urgently if needed.  Total duration of direct patient care provided via video conference: 8 minutes   Marsa Officer, DO [facility] 01/06/2024, 11:51 AM

## 2024-01-06 NOTE — Patient Instructions (Addendum)
 Thank you for coming to the office today.  Dose reduction Venlafaxine  XR 150 down to 75mg   Please schedule a Follow-up Appointment to: Return for 7 month fasting lab > 1 week later Annual Physical.  If you have any other questions or concerns, please feel free to call the office or send a message through MyChart. You may also schedule an earlier appointment if necessary.  Additionally, you may be receiving a survey about your experience at our office within a few days to 1 week by e-mail or mail. We value your feedback.  Marsa Officer, DO [facility], [place]

## 2024-04-18 ENCOUNTER — Encounter: Payer: Self-pay | Admitting: Family Medicine

## 2024-04-27 ENCOUNTER — Ambulatory Visit: Admitting: Family Medicine

## 2024-04-30 ENCOUNTER — Encounter: Payer: Self-pay | Admitting: Family Medicine

## 2024-04-30 ENCOUNTER — Telehealth: Admitting: Family Medicine

## 2024-04-30 DIAGNOSIS — F410 Panic disorder [episodic paroxysmal anxiety] without agoraphobia: Secondary | ICD-10-CM

## 2024-04-30 DIAGNOSIS — F411 Generalized anxiety disorder: Secondary | ICD-10-CM

## 2024-04-30 DIAGNOSIS — F5101 Primary insomnia: Secondary | ICD-10-CM

## 2024-04-30 DIAGNOSIS — G43809 Other migraine, not intractable, without status migrainosus: Secondary | ICD-10-CM

## 2024-04-30 DIAGNOSIS — G43909 Migraine, unspecified, not intractable, without status migrainosus: Secondary | ICD-10-CM

## 2024-04-30 MED ORDER — BUSPIRONE HCL 5 MG PO TABS: 5.0000 mg | ORAL_TABLET | Freq: Two times a day (BID) | ORAL | 2 refills | Status: AC | PRN

## 2024-04-30 NOTE — Progress Notes (Signed)
 Subjective:    Patient ID: Sharon Ponce, female    DOB: [birth year], 53 y.o.   MRN: [id number]  Sharon Ponce is a 53 y.o. female presenting on 04/30/2024 for Anxiety   Virtual / Telehealth Encounter - Video Visit via MyChart The purpose of this virtual visit is to provide medical care while limiting exposure to the novel coronavirus (COVID19) for both patient and office staff.  Consent was obtained for remote visit:  Yes.   Answered questions that patient had about telehealth interaction:  Yes.   I discussed the limitations, risks, security and privacy concerns of performing an evaluation and management service by video/telephone. I also discussed with the patient that there may be a patient responsible charge related to this service. The patient expressed understanding and agreed to proceed.  Patient Location: Home Provider Location: Sharon Ponce (Office)  Participants in virtual visit: - Patient: Sharon Ponce - CMA: Alan Fontana CMA - Provider: Dr Edman   HPI  Discussed the use of AI scribe software for clinical note transcription with the patient, who gave verbal consent to proceed.  History of Present Illness   Sharon Ponce is a 53 year old female who presents with increased anxiety and episodic migraines.  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 - Anxiety exacerbated by external events and upsetting news usually related to politics - Experiences rage and fear in response  to stressful news - No suicidal ideation or depression - Utilizes an outlet to discuss anxiety, but does so sparingly to avoid over-reliance - Plans to use an upcoming three-week break to reset and adjust her routine for anxiety management  Pharmacologic management of anxiety and sleep - Venlafaxine  (Effexor ) used for anxiety and migraine prophylaxis, maintains baseline but does not address event-triggered anxiety - Temazepam  used for sleep, no withdrawal symptoms reported    Episodic Migraine headaches Stress and anxiety trigger her migraines. Also weather changes Frequency 3+ occurrence per month or more if stressed - taking Rizatriptan  ODT AS NEEDED, has refills      04/30/2024    2:28 PM 01/06/2024   12:50 PM 07/25/2023    9:18 AM  Depression screen PHQ 2/9  Decreased Interest 0 0 2  Down, Depressed, Hopeless 0 0 1  PHQ - 2 Score 0 0 3  Altered sleeping 2 2 2   Tired, decreased energy 1 1 1   Change in appetite 0 0 0  Feeling bad or failure about yourself  0 0 0  Trouble concentrating 0 0 1  Moving slowly or fidgety/restless 0 0 0  Suicidal thoughts 0 0 0  PHQ-9 Score 3 3  7    Difficult doing work/chores Not difficult at all Not difficult at all Somewhat difficult     Data saved with a previous flowsheet row definition       04/30/2024    2:28 PM 01/06/2024   12:51 PM 07/25/2023    9:19 AM 07/08/2023    3:48 PM  GAD 7 : Generalized Anxiety Score  Nervous, Anxious, on Edge  3 1 2 3   Control/stop worrying 3 1 2 3   Worry too much - different things 1 1 1 1   Trouble relaxing 1 0 2 3  Restless 1 0 1 3  Easily annoyed or irritable 3 1 2 3   Afraid - awful might happen 1 0 2 3  Total GAD 7 Score 13 4 12 19   Anxiety Difficulty Somewhat difficult Not difficult at all Somewhat difficult Extremely difficult    Social History[1]  Review of Systems Per HPI unless specifically indicated above     Objective:    There were no vitals taken for this visit.  Wt Readings from  Last 3 Encounters:  07/25/23 125 lb 9.6 oz (57 kg)  07/08/23 125 lb (56.7 kg)  06/21/22 152 lb 9.6 oz (69.2 kg)     Physical Exam  Note examination was completely remotely via video observation objective data only  Gen - well-appearing, no acute distress or apparent pain, comfortable HEENT - eyes appear clear without discharge or redness Heart/Lungs - cannot examine virtually - observed no evidence of coughing or labored breathing. Abd - cannot examine virtually  Skin - face visible today- no rash Neuro - awake, alert, oriented Psych - anxious appearing   Results for orders placed or performed in visit on 07/25/23  Cytology - PAP   Collection Time: 07/25/23  9:23 AM  Result Value Ref Range   High risk HPV Negative    Adequacy      Satisfactory for evaluation; transformation zone component PRESENT.   Diagnosis      - Negative for intraepithelial lesion or malignancy (NILM)   Microorganisms Shift in flora suggestive of bacterial vaginosis    Comment Normal Reference Range HPV - Negative       Assessment & Plan:   Problem List Items Addressed This Visit     Episodic migraine   Generalized anxiety disorder with panic attacks - Primary   Relevant Medications   busPIRone (BUSPAR) 5 MG tablet   Primary insomnia     Generalized anxiety disorder with panic attacks Increased anxiety in large groups or stressful environments. Venlafaxine  insufficient for acute episodes.  No depression endorsed.  - Continue venlafaxine  as prescribed. - Prescribed Buspar 5 mg, 60 pills, up to twice daily as needed for anxiety. Can use this as a AS NEEDED dose based on her schedule. - Not candidate for benzodiazpine for anxiety, and she does take Temazepam  AS NEEDED for insomnia. - Encouraged use of mental health resources and counseling at work. - Advised to reach out if additional mental health support is needed.  Insomnia Secondary to anxiety On Temazepam   Episodic Migraine Worsening  with stressors and anxiety On AS NEEDED Rizatriptan  for abortive therapy and Venlafaxine  SNRI prophylaxis      No orders of the defined types were placed in this encounter.   Meds ordered this encounter  Medications   busPIRone (BUSPAR) 5 MG tablet    Sig: Take 1 tablet (5 mg total) by mouth 2 (two) times daily as needed (anxiety).    Dispense:  60 tablet    Refill:  2    Follow up plan: Return if symptoms worsen or fail to improve.   Patient verbalizes understanding with the above medical recommendations including the limitation of remote medical advice.  Specific follow-up and call-back criteria were given for patient to follow-up or seek medical care more urgently if needed.  Total duration of direct patient care provided via video conference: 10 minutes  Marsa Officer, DO [facility] 04/30/2024, 2:25 PM     [1]  Social History Tobacco Use   Smoking status: Some Days    Current packs/day: 0.25    Average packs/day: 0.3 packs/day for 20.0 years (5.0 ttl pk-yrs)    Types: Cigarettes   Smokeless tobacco: Never   Tobacco comments:    some days  Vaping Use   Vaping status: Never Used  Substance Use Topics   Alcohol use: Never   Drug use: Never

## 2024-04-30 NOTE — Patient Instructions (Signed)
   Please schedule a Follow-up Appointment to: No follow-ups on file.  If you have any other questions or concerns, please feel free to call the office or send a message through MyChart. You may also schedule an earlier appointment if necessary.  Additionally, you may be receiving a survey about your experience at our office within a few days to 1 week by e-mail or mail. We value your feedback.  Saralyn Pilar, DO [facility], [place]

## 2024-05-24 ENCOUNTER — Other Ambulatory Visit: Payer: Self-pay | Admitting: Family Medicine

## 2024-05-24 DIAGNOSIS — F5101 Primary insomnia: Secondary | ICD-10-CM

## 2024-05-24 NOTE — Telephone Encounter (Signed)
 Pt called back and needs to change the pharmacy on this rx. It needs to go to [place]. [id number] - [place] [address]), [place] - [address] [[id number]]  she said that they are fixing the issue.

## 2024-05-24 NOTE — Telephone Encounter (Signed)
 Copied from CRM [id number]. Topic: Clinical - Medication Refill >> May 24, 2024  3:41 PM Shanda MATSU wrote: Medication: temazepam  (RESTORIL ) 15 MG capsule  Has the patient contacted their pharmacy? No (Agent: If no, request that the patient contact the pharmacy for the refill. If patient does not wish to contact the pharmacy document the reason why and proceed with request.) (Agent: If yes, when and what did the pharmacy advise?)  This is the patient's preferred pharmacy:  [facility] Address: [address] [place], [place] [address] Phone: [phone] Is this the correct pharmacy for this prescription? Yes If no, delete pharmacy and type the correct one.   Has the prescription been filled recently? No  Is the patient out of the medication? Yes  Has the patient been seen for an appointment in the last year OR does the patient have an upcoming appointment? Yes  Can we respond through MyChart? No  Agent: Please be advised that Rx refills may take up to 3 business days. We ask that you follow-up with your pharmacy.

## 2024-05-25 ENCOUNTER — Other Ambulatory Visit: Payer: Self-pay

## 2024-05-25 DIAGNOSIS — F5101 Primary insomnia: Secondary | ICD-10-CM

## 2024-05-25 MED ORDER — TEMAZEPAM 15 MG PO CAPS: ORAL_CAPSULE | ORAL | 5 refills | Status: AC

## 2024-05-25 MED ORDER — TEMAZEPAM 15 MG PO CAPS: ORAL_CAPSULE | ORAL | 5 refills | Status: DC

## 2024-08-06 ENCOUNTER — Other Ambulatory Visit

## 2024-08-13 ENCOUNTER — Encounter: Admitting: Family Medicine
